# Patient Record
Sex: Male | Born: 1960 | Hispanic: No | Marital: Married | Smoking: Former smoker
Health system: Southern US, Community
[De-identification: ages and names within clinical notes are randomized; demographics above are authoritative.]

## PROBLEM LIST (undated history)

## (undated) DIAGNOSIS — N200 Calculus of kidney: Secondary | ICD-10-CM

## (undated) DIAGNOSIS — E079 Disorder of thyroid, unspecified: Secondary | ICD-10-CM

## (undated) DIAGNOSIS — I10 Essential (primary) hypertension: Secondary | ICD-10-CM

## (undated) HISTORY — PX: OTHER SURGICAL HISTORY: SHX169

## (undated) HISTORY — DX: Disorder of thyroid, unspecified: E07.9

## (undated) HISTORY — DX: Calculus of kidney: N20.0

---

## 2000-02-13 ENCOUNTER — Emergency Department (HOSPITAL_COMMUNITY): Admission: EM | Admit: 2000-02-13 | Discharge: 2000-02-13 | Payer: Self-pay | Admitting: Emergency Medicine

## 2000-09-06 ENCOUNTER — Emergency Department (HOSPITAL_COMMUNITY): Admission: EM | Admit: 2000-09-06 | Discharge: 2000-09-06 | Payer: Self-pay | Admitting: Emergency Medicine

## 2000-09-07 ENCOUNTER — Encounter: Payer: Self-pay | Admitting: Emergency Medicine

## 2000-09-09 ENCOUNTER — Encounter: Payer: Self-pay | Admitting: Urology

## 2000-09-09 ENCOUNTER — Encounter: Admission: RE | Admit: 2000-09-09 | Discharge: 2000-09-09 | Payer: Self-pay | Admitting: Urology

## 2000-09-11 ENCOUNTER — Ambulatory Visit (HOSPITAL_COMMUNITY): Admission: RE | Admit: 2000-09-11 | Discharge: 2000-09-11 | Payer: Self-pay | Admitting: Urology

## 2000-10-05 ENCOUNTER — Encounter: Admission: RE | Admit: 2000-10-05 | Discharge: 2000-10-05 | Payer: Self-pay | Admitting: Family Medicine

## 2001-04-19 ENCOUNTER — Encounter: Admission: RE | Admit: 2001-04-19 | Discharge: 2001-04-19 | Payer: Self-pay | Admitting: Family Medicine

## 2001-05-07 ENCOUNTER — Encounter: Admission: RE | Admit: 2001-05-07 | Discharge: 2001-05-07 | Payer: Self-pay | Admitting: Family Medicine

## 2001-05-18 ENCOUNTER — Encounter: Admission: RE | Admit: 2001-05-18 | Discharge: 2001-05-18 | Payer: Self-pay | Admitting: Family Medicine

## 2011-06-11 ENCOUNTER — Ambulatory Visit: Payer: BC Managed Care – PPO | Admitting: Family Medicine

## 2011-06-11 VITALS — BP 143/88 | HR 76 | Temp 98.1°F | Resp 16 | Ht 76.0 in | Wt 178.0 lb

## 2011-06-11 DIAGNOSIS — R351 Nocturia: Secondary | ICD-10-CM

## 2011-06-11 DIAGNOSIS — R350 Frequency of micturition: Secondary | ICD-10-CM

## 2011-06-11 LAB — POCT URINALYSIS DIPSTICK
Bilirubin, UA: NEGATIVE
Glucose, UA: NEGATIVE
Ketones, UA: NEGATIVE
Leukocytes, UA: NEGATIVE
Nitrite, UA: NEGATIVE
Protein, UA: NEGATIVE
Spec Grav, UA: <=1.005
Urobilinogen, UA: 0.2
pH, UA: 5.5

## 2011-06-11 LAB — POCT CBC
Granulocyte percent: 33.2 %G — AB (ref 37–80)
HCT, POC: 41.3 % — AB (ref 43.5–53.7)
Hemoglobin: 13.6 g/dL — AB (ref 14.1–18.1)
Lymph, poc: 1.7 (ref 0.6–3.4)
MCH, POC: 25.7 pg — AB (ref 27–31.2)
MCHC: 32.9 g/dL (ref 31.8–35.4)
MCV: 78.1 fL — AB (ref 80–97)
MID (cbc): 0.3 (ref 0–0.9)
MPV: 8.5 fL (ref 0–99.8)
POC Granulocyte: 1 — AB (ref 2–6.9)
POC LYMPH PERCENT: 56.5 %L — AB (ref 10–50)
POC MID %: 10.3 %M (ref 0–12)
Platelet Count, POC: 300 10*3/uL (ref 142–424)
RBC: 5.29 M/uL (ref 4.69–6.13)
RDW, POC: 14 %
WBC: 3.8 10*3/uL — AB (ref 4.6–10.2)

## 2011-06-11 LAB — PSA: PSA: 1.15 ng/mL (ref <=4.00)

## 2011-06-11 LAB — POCT UA - MICROSCOPIC ONLY
Bacteria, U Microscopic: NEGATIVE
Casts, Ur, LPF, POC: NEGATIVE
Crystals, Ur, HPF, POC: NEGATIVE
Epithelial cells, urine per micros: NEGATIVE
Mucus, UA: NEGATIVE
RBC, urine, microscopic: NEGATIVE
WBC, Ur, HPF, POC: NEGATIVE
Yeast, UA: NEGATIVE

## 2011-06-11 LAB — GLUCOSE, POCT (MANUAL RESULT ENTRY): POC Glucose: 101

## 2011-06-11 MED ORDER — CIPROFLOXACIN HCL 500 MG PO TABS: 500.0000 mg | ORAL_TABLET | Freq: Two times a day (BID) | ORAL | Status: AC

## 2011-06-11 NOTE — Patient Instructions (Signed)
Decrease caffeine intake, and avoid drinking caffeinated beverages in the afternoon.  Start antibiotic, and recheck in 1 week. Return to the clinic or go to the nearest emergency room if any of your symptoms worsen or new symptoms occur.

## 2011-06-11 NOTE — Progress Notes (Signed)
Subjective:    Patient ID: Vincent Gentry, male    DOB: 08/18/1960, 51 y.o.   MRN: [id number]  HPI Vincent Gentry is a 51 y.o. male C/o urinary frequency/nocturia - past 2 weeks.  4-5 episodes per night.  No prior similar symptoms.  No increased thirst, felt like lost weight  - about 4 pounds past few months.  Kidney stones in past - surgery x 2 - most recent in 2002 No primary care provider.    Married - no extramarital sexual contacts.  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 Multiple family members with diabetes. Drinks about 6 caffeineated drinks per day.  Questions repeated, rephrased at times - understanding expressed.  From [place] Review of Systems  Constitutional: Negative for fever and chills.  Eyes: Negative for visual disturbance.  Gastrointestinal: Negative for nausea, vomiting and abdominal pain.  Genitourinary: Positive for urgency, frequency and testicular pain. Negative for dysuria, hematuria and difficulty urinating.       Nocturia.  Occasional lower R groin to R testicle at times. Past 2 weeks.        Objective:   Physical Exam  Constitutional: He is oriented to person, place, and time. He appears well-developed and well-nourished.  HENT:  Head: Normocephalic and atraumatic.  Cardiovascular: Normal rate, regular rhythm, normal heart sounds and intact distal pulses.   Pulmonary/Chest: Effort normal and breath sounds normal.  Abdominal: Soft. Bowel sounds are normal. There is tenderness in the right lower quadrant. There is no rebound and no guarding. No hernia. Hernia confirmed negative in the right inguinal area and confirmed negative in the left inguinal area.       minimal rlq ttp with deep palpation.   Midline lower scar well-healed.    Genitourinary: Testes normal and penis normal. Prostate is not enlarged. Right testis shows no tenderness. Left testis shows no tenderness. No discharge found.  Neurological: He is alert and oriented to person, place, and time.    Skin: Skin is warm and dry. No rash noted.  Psychiatric: He has a normal mood and affect. His behavior is normal.    Results for orders placed in visit on 06/11/11  POCT UA - MICROSCOPIC ONLY      Component Value Range   WBC, Ur, HPF, POC neg     RBC, urine, microscopic neg     Bacteria, U Microscopic neg     Mucus, UA neg     Epithelial cells, urine per micros neg     Crystals, Ur, HPF, POC neg     Casts, Ur, LPF, POC neg     Yeast, UA neg    POCT URINALYSIS DIPSTICK      Component Value Range   Color, UA yellow     Clarity, UA clear     Glucose, UA neg     Bilirubin, UA neg     Ketones, UA neg     Spec Grav, UA <=1.005     Blood, UA trace-intact     pH, UA 5.5     Protein, UA neg     Urobilinogen, UA 0.2     Nitrite, UA neg     Leukocytes, UA Negative    POCT CBC      Component Value Range   WBC 3.8 (*) 4.6 - 10.2 (K/uL)   Lymph, poc 1.7  0.6 - 3.4    POC LYMPH PERCENT 56.5 (*) 10 - 50 (%L)   MID (cbc) 0.3  0 - 0.9  POC MID % 10.3  0 - 12 (%M)   POC Granulocyte 1.0 (*) 2 - 6.9    Granulocyte percent 33.2 (*) 37 - 80 (%G)   RBC 5.29  4.69 - 6.13 (M/uL)   Hemoglobin 13.6 (*) 14.1 - 18.1 (g/dL)   HCT, POC 09.8 (*) 11.9 - 53.7 (%)   MCV 78.1 (*) 80 - 97 (fL)   MCH, POC 25.7 (*) 27 - 31.2 (pg)   MCHC 32.9  31.8 - 35.4 (g/dL)   RDW, POC 14.7     Platelet Count, POC 300  142 - 424 (K/uL)   MPV 8.5  0 - 99.8 (fL)  GLUCOSE, POCT (MANUAL RESULT ENTRY)      Component Value Range   POC Glucose 101           Assessment & Plan:    Vincent Gentry is a 50 y.o. male 1. Frequent urination at night  POCT UA - Microscopic Only, POCT urinalysis dipstick, PSA, POCT glucose (manual entry)  2. Urinary frequency  POCT CBC, POCT glucose (manual entry)   Reassuring cbc and UA.  Possible subclinical prostatitis vs. epididymitis with episodic R testicular pain.  Trace hematuria on U/a - hx of nephrolithiasis. -- ddx includes nephrolith.  Check PSA, start cipro 500mg  BID #20,  decrease caffeine intake by 1/2, and avoid afternoon caffeine, and recheck in 1 week, sooner if worse.

## 2011-06-18 ENCOUNTER — Ambulatory Visit: Payer: BC Managed Care – PPO | Admitting: Family Medicine

## 2011-06-18 VITALS — BP 139/78 | HR 71 | Temp 98.6°F | Resp 18 | Wt 176.0 lb

## 2011-06-18 DIAGNOSIS — E049 Nontoxic goiter, unspecified: Secondary | ICD-10-CM

## 2011-06-18 DIAGNOSIS — R350 Frequency of micturition: Secondary | ICD-10-CM

## 2011-06-18 DIAGNOSIS — N419 Inflammatory disease of prostate, unspecified: Secondary | ICD-10-CM

## 2011-06-18 LAB — THYROID PANEL WITH TSH
Free Thyroxine Index: 6.7 — ABNORMAL HIGH (ref 1.0–3.9)
T3 Uptake: 40.1 % — ABNORMAL HIGH (ref 22.5–37.0)
T4, Total: 16.7 ug/dL — ABNORMAL HIGH (ref 5.0–12.5)
TSH: <0.008 u[IU]/mL — ABNORMAL LOW (ref 0.350–4.500)

## 2011-06-18 NOTE — Progress Notes (Signed)
Subjective: Patient is here for followup with regard to last visit visit. He was here with frequent nocturia. He has taken a course of Cipro and decrease his caffeine intake in the evenings, and is urinating much less at nighttime now. He has not quite yet faced the Cipro. I reviewed his laboratory tests from last week which are below, and they all look good. Results for orders placed in visit on 06/11/11  POCT UA - MICROSCOPIC ONLY      Component Value Range   WBC, Ur, HPF, POC neg     RBC, urine, microscopic neg     Bacteria, U Microscopic neg     Mucus, UA neg     Epithelial cells, urine per micros neg     Crystals, Ur, HPF, POC neg     Casts, Ur, LPF, POC neg     Yeast, UA neg    POCT URINALYSIS DIPSTICK      Component Value Range   Color, UA yellow     Clarity, UA clear     Glucose, UA neg     Bilirubin, UA neg     Ketones, UA neg     Spec Grav, UA <=1.005     Blood, UA trace-intact     pH, UA 5.5     Protein, UA neg     Urobilinogen, UA 0.2     Nitrite, UA neg     Leukocytes, UA Negative    POCT CBC      Component Value Range   WBC 3.8 (*) 4.6 - 10.2 (K/uL)   Lymph, poc 1.7  0.6 - 3.4    POC LYMPH PERCENT 56.5 (*) 10 - 50 (%L)   MID (cbc) 0.3  0 - 0.9    POC MID % 10.3  0 - 12 (%M)   POC Granulocyte 1.0 (*) 2 - 6.9    Granulocyte percent 33.2 (*) 37 - 80 (%G)   RBC 5.29  4.69 - 6.13 (M/uL)   Hemoglobin 13.6 (*) 14.1 - 18.1 (g/dL)   HCT, POC 16.1 (*) 09.6 - 53.7 (%)   MCV 78.1 (*) 80 - 97 (fL)   MCH, POC 25.7 (*) 27 - 31.2 (pg)   MCHC 32.9  31.8 - 35.4 (g/dL)   RDW, POC 04.5     Platelet Count, POC 300  142 - 424 (K/uL)   MPV 8.5  0 - 99.8 (fL)  PSA      Component Value Range   PSA 1.15  <=4.00 (ng/mL)  GLUCOSE, POCT (MANUAL RESULT ENTRY)      Component Value Range   POC Glucose 101      He has had a goiter for many years since he was young. It was looked at in [place] many years ago. He has never had a thyroid scan. After going back to [place] he thinks it  has gotten a little bit larger. Cord is run in his family.  Objective: Right lower large and obviously visible goiter in his neck. It is roughly the size of my 4 fingers. No major masses though there is some mild nodularity of it.  No CVA tenderness. Abdomen soft without mass or tenderness.  Assessment: Urinary frequency, improved Possible low grade prostatitis, resolved with Cipro Thyroid goiter  Plan: Check thyroid scan and proceed from there. There are regions of [place] that have a lot of goiters due to the deficiency of iodine, which is probably his situation. However due to the size of his  goiter we will have to decide what needs to be done after the scan has been done.  If his urinary frequency increases upon finishing the Cipro, then we probably should go ahead and treat him for a 30 day course as if he did have some chronic prostatitis.

## 2011-06-18 NOTE — Patient Instructions (Signed)
Goiter  Goiter is an enlarged thyroid gland. The thyroid gland sits at the base of the front of the neck. The gland produces hormones that regulate mood, body temperature, pulse rate, and digestion. Most goiters are painless and are not a cause for serious concern. Goiters and conditions that cause goiters can be treated if necessary.    CAUSES    Common causes of goiter include:   Graves disease (causes too much hormone to be produced [hyperthyroidism]).   Hashimoto's disease (causes too little hormone to be produced [hypothyroidism]).   Thyroiditis (inflammation of the thyroid sometimes caused by virus or pregnancy).   Nodular goiter (small bumps form; sometimes called toxic nodular goiter).   Pregnancy.   Thyroid cancer (very few goiters with nodules are cancerous).   Certain medications.   Radiation exposure.   Iodine deficiency (more common in developing countries in inland populations).  RISK FACTORS  Risk factors for goiter include:   A family history of goiter.   Male gender.   Inadequate iodine in the diet.   Age older than 40 years.  SYMPTOMS    Many goiters do not cause symptoms. When symptoms do occur, they may include:   Swelling in the lower part of the neck. This swelling can range from a very small bump to a large lump.   A tight feeling in the throat.   A hoarse voice.  Less commonly, a goiter may result in:   Coughing.   Wheezing.   Difficulty swallowing.   Difficulty breathing.   Bulging neck veins.   Dizziness.  When a goiter is the result of hyperthyroidism, symptoms may include:   Rapid or irregular heart beat.   Sicknessin your stomach (nausea).   Vomiting.   Diarrhea.   Shaking.   Irritable feeling.   Bulging eyes.   Weight loss.   Heat sensitivity.   Anxiety.  When a goiter is the result of hypothyroidism, symptoms may include:   Tiredness.   Dry skin.   Constipation.   Weight gain.   Irregular menstrual cycle.   Depressed mood.    Sensitivity to cold.  DIAGNOSIS    Tests used to diagnose goiter include:   A physical exam.   Blood tests, including thyroid hormone levels and antibody testing.   Ultrasonography, computerized X-ray scan (computed tomography, CT) or computerized magnetic scan (magnetic resonance imaging, MRI).   Thyroid scan (imaging along with safe radioactive injection).   Tissue sample taken (biopsy) of nodules. This is sometimes done to confirm that the nodules are not cancerous.  TREATMENT    Treatment will depend on the cause of the goiter. Treatment may include:   Monitoring. In some cases, no treatment is necessary, and your doctor will monitor yourcondition at regular check ups.   Medications and supplements. Thyroid medication (thyroid hormone replacement) is available for hyperthroidism and hypothyroidism.   If inflammation is the cause, over-the-counter medication or steroid medication may be recommended.   Goiters caused by iodine deficiency can be treated with iodine supplements or changes in diet.   Radioactive iodine treatment. Radioactive iodine is injected into the blood. It travels to the thyroid gland, kills thyroid cells, and reduces the size of the gland. This is only used when the thyroid gland is overactive. Lifelong thyroid hormone medication is often necessary after this treatment.   Surgery. A procedure to remove all or part of the gland may be recommended in severe cases or when cancer is the cause. Hormones can be taken   to replace the hormones normally produced by the thyroid.  HOME CARE INSTRUCTIONS     Take medications as directed.   Follow your caregiver's recommendations for any dietary changes.   Follow up with your caregiver for further examination and testing, as directed.  PREVENTION     If you have a family history of goiter, discuss screening with your doctor.   Make sure you are getting enough iodine in your diet.    Use of iodized table salt can help prevent iodine deficiency.  Document Released: 07/17/2009 Document Revised: 01/16/2011 Document Reviewed: 07/17/2009  [employer] Patient Information 2012 [employer].

## 2011-06-21 ENCOUNTER — Telehealth: Payer: Self-pay | Admitting: Family Medicine

## 2011-06-21 NOTE — Telephone Encounter (Signed)
Spoke with patient.  Thyroid is overactive.  He will return 4-5 days after thyroid scan is done. c/w Grave's Disease.

## 2011-06-26 ENCOUNTER — Encounter (HOSPITAL_COMMUNITY)
Admission: RE | Admit: 2011-06-26 | Discharge: 2011-06-26 | Disposition: A | Discharge status: Home / Self Care | Payer: BC Managed Care – PPO | Source: Ambulatory Visit | Attending: Family Medicine | Admitting: Family Medicine

## 2011-06-26 DIAGNOSIS — E049 Nontoxic goiter, unspecified: Secondary | ICD-10-CM | POA: Insufficient documentation

## 2011-06-27 ENCOUNTER — Encounter (HOSPITAL_COMMUNITY)
Admission: RE | Admit: 2011-06-27 | Discharge: 2011-06-27 | Disposition: A | Discharge status: Home / Self Care | Payer: BC Managed Care – PPO | Source: Ambulatory Visit | Attending: Family Medicine | Admitting: Family Medicine

## 2011-06-27 DIAGNOSIS — E0520 Thyrotoxicosis with toxic multinodular goiter without thyrotoxic crisis or storm: Secondary | ICD-10-CM | POA: Insufficient documentation

## 2011-06-27 MED ORDER — SODIUM PERTECHNETATE TC 99M INJECTION
10.0000 | Freq: Once | INTRAVENOUS | Status: AC | PRN
  Administered 2011-06-27: 10 via INTRAVENOUS

## 2011-07-02 ENCOUNTER — Encounter: Payer: Self-pay | Admitting: Family Medicine

## 2011-07-02 ENCOUNTER — Ambulatory Visit (INDEPENDENT_AMBULATORY_CARE_PROVIDER_SITE_OTHER): Payer: BC Managed Care – PPO | Admitting: Family Medicine

## 2011-07-02 VITALS — BP 134/76 | HR 83 | Temp 98.0°F | Resp 16 | Ht 73.0 in | Wt 175.0 lb

## 2011-07-02 DIAGNOSIS — E0590 Thyrotoxicosis, unspecified without thyrotoxic crisis or storm: Secondary | ICD-10-CM

## 2011-07-02 NOTE — Progress Notes (Signed)
This 51 year old married man with 2 children works at [employer] and is trained as a Comptroller in [place]. He comes in with a history of weight loss, tremulousness, and fatigue. Dr. Alwyn Ren has been working him up for a thyroid condition. He's had a goiter since 1982 and has a family history of thyroid disease. The symptoms of hyperthyroidism have started over the last several months. He's had to change clothing because of or weight loss.  Objective: Very pleasant man in no acute distress Very fine tremor Palpable enlarged multinodular goiter  RADIOLOGY REPORT*  Clinical Data: Clinical hyperthyroidism.  THYROID SCAN AND UPTAKE - 24 HOURS  Technique: Following the per oral administration of I-131 sodium  iodide, the patient returned at 24 hours and uptake measurements  were acquired with the uptake probe centered on the neck. Thyroid  imaging was performed following the intravenous administration of  the Tc-41m Pertechnetate.  Radi[employer]armaceuticals: 13.9 uCi I-131 Sodium Iodide and 10.0 mCi TC-  59m Pertechnetate  Comparison: None  Findings: The thyroid gland is markedly enlarged and demonstrates a  very heterogeneous nodular appearance on the thyroid scan. I  suspect there are multiple hyperfunctioning warm/hot nodules  suppressing the rest of the gland. No obvious dominant cold  lesion.  The 24 hour I-131 uptake was calculated at 46%. Normal is 10-30%.  IMPRESSION:  1. Multinodular toxic goiter.  2. Elevated 24 hour I-131 uptake at 46%.   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 If we can accomplish this this week then I will put him on metoprolol.

## 2011-07-02 NOTE — Patient Instructions (Signed)
     RADIOLOGY REPORT*  Clinical Data: Clinical hyperthyroidism.  THYROID SCAN AND UPTAKE - 24 HOURS  Technique: Following the per oral administration of I-131 sodium  iodide, the patient returned at 24 hours and uptake measurements  were acquired with the uptake probe centered on the neck. Thyroid  imaging was performed following the intravenous administration of  the Tc-90m Pertechnetate.  Radi[employer]arm[employer]: 13.9 uCi I-131 Sodium Iodide and 10.0 mCi TC-  36m Pertechnetate  Comparison: None  Findings: The thyroid gland is markedly enlarged and demonstrates a  very heterogeneous nodular appearance on the thyroid scan. I  suspect there are multiple hyperfunctioning warm/hot nodules  suppressing the rest of the gland. No obvious dominant cold  lesion.  The 24 hour I-131 uptake was calculated at 46%. Normal is 10-30%.  IMPRESSION:  1. Multinodular toxic goiter.  2. Elevated 24 hour I-131 uptake at 46%.   Range T4, Total 16.7 (H) 5.0 - 12.5 ug/dL T3 Uptake 45.4 (H) 09.8 - 37.0 % Free Thyroxine Index 6.7 (H) 1.0 - 3.9 TSH <0.008 (L) 0.350 - 4.500 uIU/mL Resulting Agency [facility]

## 2011-07-22 ENCOUNTER — Other Ambulatory Visit: Payer: Self-pay | Admitting: Endocrinology

## 2011-07-22 DIAGNOSIS — E0590 Thyrotoxicosis, unspecified without thyrotoxic crisis or storm: Secondary | ICD-10-CM

## 2011-08-01 ENCOUNTER — Encounter (HOSPITAL_COMMUNITY)
Admission: RE | Admit: 2011-08-01 | Discharge: 2011-08-01 | Disposition: A | Discharge status: Home / Self Care | Payer: BC Managed Care – PPO | Source: Ambulatory Visit | Attending: Endocrinology | Admitting: Endocrinology

## 2011-08-01 DIAGNOSIS — E0590 Thyrotoxicosis, unspecified without thyrotoxic crisis or storm: Secondary | ICD-10-CM | POA: Insufficient documentation

## 2011-08-01 MED ORDER — SODIUM IODIDE I 131 CAPSULE
31.4000 | Freq: Once | INTRAVENOUS | Status: AC | PRN
  Administered 2011-08-01: 31.4 via ORAL

## 2011-08-04 ENCOUNTER — Ambulatory Visit (INDEPENDENT_AMBULATORY_CARE_PROVIDER_SITE_OTHER): Payer: BC Managed Care – PPO | Admitting: Family Medicine

## 2011-08-04 VITALS — BP 124/77 | HR 81 | Temp 98.7°F | Resp 16 | Ht 72.0 in | Wt 176.0 lb

## 2011-08-04 DIAGNOSIS — Z23 Encounter for immunization: Secondary | ICD-10-CM

## 2011-08-04 DIAGNOSIS — Z789 Other specified health status: Secondary | ICD-10-CM

## 2011-08-04 MED ORDER — MENINGOCOCCAL A C Y&W-135 CONJ IM INJ
0.5000 mL | INJECTION | Freq: Once | INTRAMUSCULAR | Status: AC
  Administered 2011-08-04: 0.5 mL via INTRAMUSCULAR

## 2011-08-04 MED ORDER — CIPROFLOXACIN HCL 500 MG PO TABS: ORAL_TABLET | ORAL | Status: DC

## 2011-08-04 NOTE — Patient Instructions (Addendum)
Go to [email or web address] for travel information for [place].  [email or web address]

## 2011-08-04 NOTE — Progress Notes (Signed)
Subjective: Patient is going to [place] in 2 weeks. He would like a meningococcal vaccine. He was presenting some time in [place], then in [place], then in [place].  He does not want to malaria prophylaxis. He says he never gets malaria. I cautioned him that having been gone for the stretcher for a long time he might be a higher risk of it. He does not want hepatitis vaccine either.  Objective: Not read not examined today  Assessment: Trouble medicine with vaccination update  Plan: Meningitis vaccine Cipro prescription for traveler's diarrhea Gave him the site he can look up the CDC guidelines for travel to [place].

## 2013-06-20 ENCOUNTER — Ambulatory Visit: Payer: BC Managed Care – PPO | Admitting: Physician Assistant

## 2013-06-20 VITALS — BP 124/78 | HR 69 | Temp 98.2°F | Resp 18 | Ht 72.0 in | Wt 193.0 lb

## 2013-06-20 DIAGNOSIS — M5380 Other specified dorsopathies, site unspecified: Secondary | ICD-10-CM

## 2013-06-20 DIAGNOSIS — M62830 Muscle spasm of back: Secondary | ICD-10-CM

## 2013-06-20 DIAGNOSIS — Z87442 Personal history of urinary calculi: Secondary | ICD-10-CM | POA: Insufficient documentation

## 2013-06-20 DIAGNOSIS — R319 Hematuria, unspecified: Secondary | ICD-10-CM

## 2013-06-20 DIAGNOSIS — M549 Dorsalgia, unspecified: Secondary | ICD-10-CM

## 2013-06-20 LAB — POCT URINALYSIS DIPSTICK
Bilirubin, UA: NEGATIVE
GLUCOSE UA: NEGATIVE
Ketones, UA: NEGATIVE
Leukocytes, UA: NEGATIVE
NITRITE UA: NEGATIVE
PH UA: 5.5
PROTEIN UA: NEGATIVE
UROBILINOGEN UA: 0.2

## 2013-06-20 LAB — POCT UA - MICROSCOPIC ONLY
Bacteria, U Microscopic: NEGATIVE
CASTS, UR, LPF, POC: NEGATIVE
CRYSTALS, UR, HPF, POC: NEGATIVE
Mucus, UA: NEGATIVE
WBC, Ur, HPF, POC: NEGATIVE
Yeast, UA: NEGATIVE

## 2013-06-20 MED ORDER — CYCLOBENZAPRINE HCL 5 MG PO TABS: 5.0000 mg | ORAL_TABLET | Freq: Three times a day (TID) | ORAL | Status: DC | PRN

## 2013-06-20 MED ORDER — MELOXICAM 7.5 MG PO TABS: 7.5000 mg | ORAL_TABLET | Freq: Every day | ORAL | Status: DC

## 2013-06-20 NOTE — Progress Notes (Signed)
I was directly involved with the patient's care and agree with the physical, diagnosis and treatment plan.  

## 2013-06-20 NOTE — Progress Notes (Signed)
Subjective:    Patient ID: Vincent Gentry, male    DOB: 04/10/1960, 53 y.o.   MRN: [id number]  HPI 52y.o male presents with 2 days of lower back pain.  Pain is localized lumbar region on both sides of spinal column without radiation to LE.  Pain is made worse with changing of position and with bending down to touch toes.  Never had back pain like this before.  Pt has hx of kidney stones, but this does not feel at all similar.  Pt states he is active and plays soccer with his kids but denies any inciting event.  Denies saddle anesthesia, bowel or bladder incontinence, new numbness or weakness of LE, personal cancer hx, IV drug use, fever, N/V/D.  Pt also had two days of dark brown urine last week that has since resolved.  No associated dysuria.  Pt has recently increased water intake so he is unsure if increase in urinary frequency is due to water or another reason.  Smoked 1ppd for 20 years, but quit in 2001.    Review of Systems  Constitutional: Negative for chills and fatigue.  HENT: Negative.   Eyes: Negative.   Respiratory: Negative for cough, shortness of breath and wheezing.   Cardiovascular: Negative for leg swelling.  Gastrointestinal: Negative for nausea, vomiting, diarrhea and abdominal distention.  Genitourinary: Negative for flank pain.  Skin: Negative for color change and rash.  Neurological: Negative for dizziness.  Hematological: Negative.        Objective:   Physical Exam  Constitutional: He is oriented to person, place, and time. He appears well-developed and well-nourished. No distress.  BP 124/78  Pulse 69  Temp(Src) 98.2 F (36.8 C) (Oral)  Resp 18  Ht 6' (1.829 m)  Wt 193 lb (87.544 kg)  BMI 26.17 kg/m2  SpO2 98%   HENT:  Head: Normocephalic.  Eyes: Conjunctivae are normal. Pupils are equal, round, and reactive to light.  Neck: Normal range of motion.  Cardiovascular: Normal rate, regular rhythm, normal heart sounds and intact distal pulses.  Exam  reveals no gallop and no friction rub.   No murmur heard. Pulmonary/Chest: Effort normal and breath sounds normal. No respiratory distress. He has no wheezes. He exhibits no tenderness.  Musculoskeletal:       Right hip: Normal.       Left hip: Normal.       Lumbar back: He exhibits normal range of motion (pain with lumbar flexion), no tenderness and no bony tenderness.  Neurological: He is alert and oriented to person, place, and time. He has normal strength. No sensory deficit. Gait normal.  Reflex Scores:      Patellar reflexes are 2+ on the right side and 2+ on the left side.      Achilles reflexes are 2+ on the right side and 2+ on the left side. EHL, hip flexion, knee flexion/ext strength symmetrical bilat.  Gait normal  Skin: Skin is warm and dry. No rash noted.  Psychiatric: He has a normal mood and affect. His behavior is normal.        Assessment & Plan:   1. Back pain 2. Muscle spasm of back Most likely musculoskeletal in nature.  Recommended heat and stretching in addition to pharm therapy  - POCT urinalysis dipstick - POCT UA - Microscopic Only - cyclobenzaprine (FLEXERIL) 5 MG tablet; Take 1 tablet (5 mg total) by mouth 3 (three) times daily as needed for muscle spasms.  Dispense: 30 tablet; Refill: 0 -  meloxicam (MOBIC) 7.5 MG tablet; Take 1-2 tablets (7.5-15 mg total) by mouth daily.  Dispense: 30 tablet; Refill: 0   3. Hematuria Will return in 10 days to recheck urine - due to age and smoking history - if still hematuria present will refer to urology

## 2013-06-20 NOTE — Patient Instructions (Addendum)
You can use heat and stretching to help with back pain   Return to clinic in 10 days to repeat urine analysis

## 2013-07-01 ENCOUNTER — Ambulatory Visit: Payer: BC Managed Care – PPO | Admitting: Physician Assistant

## 2013-07-01 VITALS — BP 126/74 | HR 79 | Temp 98.4°F | Resp 17 | Ht 72.5 in | Wt 194.0 lb

## 2013-07-01 DIAGNOSIS — R319 Hematuria, unspecified: Secondary | ICD-10-CM

## 2013-07-01 LAB — POCT URINALYSIS DIPSTICK
BILIRUBIN UA: NEGATIVE
Blood, UA: NEGATIVE
Glucose, UA: NEGATIVE
Ketones, UA: NEGATIVE
LEUKOCYTES UA: NEGATIVE
NITRITE UA: NEGATIVE
PH UA: 5.5
PROTEIN UA: NEGATIVE
Spec Grav, UA: <=1.005
Urobilinogen, UA: 0.2

## 2013-07-01 LAB — POCT UA - MICROSCOPIC ONLY
Bacteria, U Microscopic: NEGATIVE
Casts, Ur, LPF, POC: NEGATIVE
Crystals, Ur, HPF, POC: NEGATIVE
Mucus, UA: NEGATIVE
RBC, URINE, MICROSCOPIC: NEGATIVE
WBC, UR, HPF, POC: NEGATIVE
YEAST UA: NEGATIVE

## 2013-07-01 NOTE — Patient Instructions (Signed)
See Dr Alwyn Ren in the next several months for a CPE - it is time for your colon cancer screening.  This would also be a good time to check your cholesterol.  You can call ahead of time to make sure you can come to the office when he is available.  When you do come in to see him you will want to come in fasting which means you have not had anything to at in at least 6 hours.

## 2013-07-01 NOTE — Progress Notes (Signed)
   Subjective:    Patient ID: Vincent Gentry, male    DOB: May 18, 1960, 53 y.o.   MRN: [id number]  HPI  Pt presents for a recheck.  His back pain is almost completely resolved and he feels much better.  He has noticed no more dark colored urine and feels good.  Review of Systems  Constitutional: Negative for fever and chills.  Genitourinary: Negative for dysuria, frequency and hematuria.       Objective:   Physical Exam  Constitutional: He is oriented to person, place, and time. He appears well-developed and well-nourished.  HENT:  Head: Normocephalic and atraumatic.  Right Ear: External ear normal.  Left Ear: External ear normal.  Pulmonary/Chest: Effort normal.  Neurological: He is alert and oriented to person, place, and time.  Skin: Skin is warm and dry.  Psychiatric: He has a normal mood and affect. His behavior is normal. Judgment and thought content normal.   Results for orders placed in visit on 07/01/13  POCT UA - MICROSCOPIC ONLY      Result Value Ref Range   WBC, Ur, HPF, POC neg     RBC, urine, microscopic neg     Bacteria, U Microscopic neg     Mucus, UA neg     Epithelial cells, urine per micros 0-1     Crystals, Ur, HPF, POC neg     Casts, Ur, LPF, POC neg     Yeast, UA neg    POCT URINALYSIS DIPSTICK      Result Value Ref Range   Color, UA yellow     Clarity, UA clear     Glucose, UA neg     Bilirubin, UA neg     Ketones, UA neg     Spec Grav, UA <=1.005     Blood, UA neg     pH, UA 5.5     Protein, UA neg     Urobilinogen, UA 0.2     Nitrite, UA neg     Leukocytes, UA Negative         Assessment & Plan:  Hematuria - Plan: POCT UA - Microscopic Only, POCT urinalysis dipstick  Today the patient has no blood in his urine.  Due to his age and remote smoking history and small amount of blood at last visit and the fact that he has not had a CPE he plans to RTC to have his CPE with Dr Alwyn Ren.  At that time, I would recommend a repeat urine and if blood  at that time refer to urology.  He is also interested in a colonoscopy that he will talk with Dr Alwyn Ren about at that visit.  Benny Lennert PA-C  [facility] 07/01/2013 1:04 PM

## 2013-07-26 ENCOUNTER — Encounter: Payer: Self-pay | Admitting: Physician Assistant

## 2013-07-26 DIAGNOSIS — E0590 Thyrotoxicosis, unspecified without thyrotoxic crisis or storm: Secondary | ICD-10-CM | POA: Insufficient documentation

## 2013-12-01 ENCOUNTER — Encounter: Payer: Self-pay | Admitting: *Deleted

## 2013-12-01 DIAGNOSIS — E0590 Thyrotoxicosis, unspecified without thyrotoxic crisis or storm: Secondary | ICD-10-CM

## 2016-05-20 ENCOUNTER — Ambulatory Visit (INDEPENDENT_AMBULATORY_CARE_PROVIDER_SITE_OTHER): Payer: BLUE CROSS/BLUE SHIELD | Admitting: Family Medicine

## 2016-05-20 VITALS — BP 102/64 | HR 88 | Temp 99.1°F | Ht 73.0 in | Wt 204.2 lb

## 2016-05-20 DIAGNOSIS — M542 Cervicalgia: Secondary | ICD-10-CM

## 2016-05-20 DIAGNOSIS — M5412 Radiculopathy, cervical region: Secondary | ICD-10-CM | POA: Diagnosis not present

## 2016-05-20 DIAGNOSIS — I4510 Unspecified right bundle-branch block: Secondary | ICD-10-CM

## 2016-05-20 DIAGNOSIS — R51 Headache: Secondary | ICD-10-CM

## 2016-05-20 DIAGNOSIS — R030 Elevated blood-pressure reading, without diagnosis of hypertension: Secondary | ICD-10-CM | POA: Diagnosis not present

## 2016-05-20 DIAGNOSIS — R002 Palpitations: Secondary | ICD-10-CM

## 2016-05-20 DIAGNOSIS — E0590 Thyrotoxicosis, unspecified without thyrotoxic crisis or storm: Secondary | ICD-10-CM

## 2016-05-20 DIAGNOSIS — E0520 Thyrotoxicosis with toxic multinodular goiter without thyrotoxic crisis or storm: Secondary | ICD-10-CM | POA: Insufficient documentation

## 2016-05-20 DIAGNOSIS — M79672 Pain in left foot: Secondary | ICD-10-CM | POA: Diagnosis not present

## 2016-05-20 DIAGNOSIS — M79645 Pain in left finger(s): Secondary | ICD-10-CM

## 2016-05-20 DIAGNOSIS — R519 Headache, unspecified: Secondary | ICD-10-CM

## 2016-05-20 MED ORDER — MELOXICAM 7.5 MG PO TABS: 7.5000 mg | ORAL_TABLET | Freq: Every day | ORAL | 0 refills | Status: DC

## 2016-05-20 MED ORDER — CYCLOBENZAPRINE HCL 5 MG PO TABS: 5.0000 mg | ORAL_TABLET | Freq: Every day | ORAL | 0 refills | Status: DC

## 2016-05-20 MED ORDER — CYCLOBENZAPRINE HCL 5 MG PO TABS: 5.0000 mg | ORAL_TABLET | Freq: Three times a day (TID) | ORAL | 0 refills | Status: DC | PRN

## 2016-05-20 NOTE — Progress Notes (Signed)
Subjective:  By signing my name below, I, Essence Howell, attest that this documentation has been prepared under the direction and in the presence of Shade Flood, MD Electronically Signed: Charline Bills, ED Scribe 05/20/2016 at 6:13 PM.   Patient ID: Vincent Gentry, male    DOB: [birth year], 56 y.o.   MRN: [id number]  Chief Complaint  Patient presents with  . Pain    left arm pain radiating to left thumb,warm to the touch x2 weeks   HPI Vincent Gentry is a 56 y.o. male who presents to Primary Care at [facility] complaining of intermittent left arm pain that starts in the left side of his neck and radiates into his left thumb for 2 weeks. Pt describes pain as a burning, tingling sensation. He states that he occasionally has similar pain in his left great toe but not in any other toes. Pt reports experiencing similar pain approximately 5 years ago when he was referred to endocrinologist Dr. Leslie Dales and treated with radiation for hyperthyroidism. He states that he is not currently taking medications for thyroid disease. Pt had a low TSH of 0.13, normal Free T3 and Free T4 yesterday through [facility]. He denies chest pain, weakness, tingling in his lower extremities.   Headaches Pt reports intermittent headaches for approximately 3 weeks. He states that he has been treating HAs with 500 mg Tylenol twice daily for the past 3 weeks with temporary relief. Despite treatment, he reports that his longest HA lasted for 3 days. Pt states that he has also been experiencing heart palpitations for approximately 1 month since starting a new unknown medication. Pt describes palpitations as heart racing but he denies any today. He reports 1 episode of sob accompanied with palpitations while carrying in groceries but none since. He also denies numbness, blurred or double vision, light-headedness, slurred speech, facial drooping. No previous cardiac related issues or cardiology evaluation.   Patient Active  Problem List   Diagnosis Date Noted  . Hyperthyroidism 07/26/2013  . History of kidney stones 06/20/2013   Past Medical History:  Diagnosis Date  . Kidney stone   . Thyroid disease    Past Surgical History:  Procedure Laterality Date  . kidney stone removal     No Known Allergies Prior to Admission medications   Medication Sig Start Date End Date Taking? Authorizing Provider  ciprofloxacin (CIPRO) 500 MG tablet Take one twice daily if needed for travelers diarrhea Patient not taking: Reported on 05/20/2016 08/04/11   Peyton Najjar, MD  cyclobenzaprine (FLEXERIL) 5 MG tablet Take 1 tablet (5 mg total) by mouth 3 (three) times daily as needed for muscle spasms. Patient not taking: Reported on 05/20/2016 06/20/13   Morrell Riddle, PA-C  meloxicam (MOBIC) 7.5 MG tablet Take 1-2 tablets (7.5-15 mg total) by mouth daily. Patient not taking: Reported on 05/20/2016 06/20/13   Morrell Riddle, PA-C   Social History   Social History  . Marital status: Married    Spouse name: N/A  . Number of children: N/A  . Years of education: N/A   Occupational History  . Not on file.   Social History Main Topics  . Smoking status: Former Games developer  . Smokeless tobacco: Not on file  . Alcohol use No  . Drug use: Unknown  . Sexual activity: Not on file   Other Topics Concern  . Not on file   Social History Narrative   From the [place] -    Review of Systems  Eyes: Negative for visual disturbance.  Respiratory: Shortness of breath: 1 episode.   Cardiovascular: Positive for palpitations. Negative for chest pain.  Musculoskeletal: Positive for arthralgias, myalgias and neck pain.  Neurological: Positive for numbness and headaches. Negative for facial asymmetry, speech difficulty, weakness and light-headedness.      Objective:   Physical Exam  Constitutional: He is oriented to person, place, and time. He appears well-developed and well-nourished. No distress.  HENT:  Head: Normocephalic and  atraumatic.  Eyes: Conjunctivae and EOM are normal. Pupils are equal, round, and reactive to light.  Neck: Neck supple. No tracheal deviation present.  Intact ROM at the cervical spine. R lateral flexion of the neck did reproduce L arm symptoms. Negative Tinnel over brachial plexus on the L.  Cardiovascular: Normal rate, regular rhythm and normal heart sounds.  Exam reveals no gallop and no friction rub.   No murmur heard. Pulmonary/Chest: Effort normal and breath sounds normal. No respiratory distress.  Musculoskeletal: Normal range of motion.  L foot: tender along great toe MTP.  L hand: tender over the [facility]st [facility] joint but normal ROM. No swelling. Cap refill less than 1 second. NVI distally. Radial pulse 2+. Normal and intact strength R and L arms.   Neurological: He is alert and oriented to person, place, and time. He displays a negative Romberg sign.  Reflex Scores:      Tricep reflexes are 2+ on the right side and 2+ on the left side.      Bicep reflexes are 2+ on the right side and 2+ on the left side.      Brachioradialis reflexes are 2+ on the right side and 2+ on the left side. No pronator drift.   Skin: Skin is warm and dry.  Skin is intact. No rash.  Psychiatric: He has a normal mood and affect. His behavior is normal.  Nursing note and vitals reviewed.  Vitals:   05/20/16 1751 05/20/16 1755  BP: (!) 146/77 102/64  Pulse: 91 88  Temp: 99.1 F (37.3 C)   TempSrc: Oral   Weight: 204 lb 3.2 oz (92.6 kg)   Height:  (1.854 m)    EKG reading done by Shade Flood, MD: Sinus rhythm RBBB. No prior EKG to review.    Assessment & Plan:    Vincent Gentry is a 56 y.o. male Left cervical radiculopathy - Plan: meloxicam (MOBIC) 7.5 MG tablet, cyclobenzaprine (FLEXERIL) 5 MG tablet, DISCONTINUED: cyclobenzaprine (FLEXERIL) 5 MG tablet Pain of left thumb - Plan: meloxicam (MOBIC) 7.5 MG tablet Left foot pain - Plan: meloxicam (MOBIC) 7.5 MG tablet Neck pain on left side  -  Left arm symptoms reproducible with cervical range of motion and based on description appear to be cervical radiculopathy. No weakness, reflexes appear equal. Left thumb and left foot pain possible degenerative changes within those respective joints.  -Trial of meloxicam 7.5 mg daily, 5 mg Flexeril 3 times a day when necessary, side effects discussed, RTC precautions if not improving.  Nonintractable episodic headache, unspecified headache type - Plan: CANCELED: Ambulatory referral to Neurology  -May be related to cervical radiculopathy and neck pain. If not improving with meloxicam, Consider referral to neuro.  RBBB Palpitations - Plan: EKG 12-Lead, CBC, Basic metabolic panel, Ambulatory referral to Cardiology, CANCELED: Ambulatory referral to Cardiology  - Intermittent. Reassuring EKG except right bundle branch block without previous EKG for comparison. Check CBC, BMP, refer to cardiology. RTC/ER precautions if worsening  Hyperthyroidism  -Recently stable levels.  Less likely cause of palpitations  Elevated blood pressure reading - Plan: EKG 12-Lead  -Improved on recheck   Meds ordered this encounter  Medications  . meloxicam (MOBIC) 7.5 MG tablet    Sig: Take 1 tablet (7.5 mg total) by mouth daily.    Dispense:  30 tablet    Refill:  0  . DISCONTD: cyclobenzaprine (FLEXERIL) 5 MG tablet    Sig: Take 1 tablet (5 mg total) by mouth at bedtime. 1 pill by mouth up to every 8 hours as needed. Start with one pill by mouth each bedtime as needed due to sedation    Dispense:  15 tablet    Refill:  0  . cyclobenzaprine (FLEXERIL) 5 MG tablet    Sig: Take 1 tablet (5 mg total) by mouth 3 (three) times daily as needed for muscle spasms.    Dispense:  15 tablet    Refill:  0   Patient Instructions    Meloxicam once per day for neck pain and cervical radiculopathy. That should also help your hand and foot pain. Recheck in 1 week, and possible x-ray at that time.  Headaches may improve with  treatment of neck pain. If headaches continue next visit, I can refer you to a neurologist. Return to the clinic or go to the nearest emergency room if any of your symptoms worsen or new symptoms occur.  I will check a blood count and electrolytes for your heart palpitations, but as those have been going on for some time, I would like you to meet with a cardiologist. If you have chest pain, shortness of breath, or worsening symptoms with this heart palpitations, would recommend evaluation to the emergency room or call 911 if needed.     Cervical Radiculopathy Cervical radiculopathy happens when a nerve in the neck (cervical nerve) is pinched or bruised. This condition can develop because of an injury or as part of the normal aging process. Pressure on the cervical nerves can cause pain or numbness that runs from the neck all the way down into the arm and fingers. Usually, this condition gets better with rest. Treatment may be needed if the condition does not improve. What are the causes? This condition may be caused by:  Injury.  Slipped (herniated) disk.  Muscle tightness in the neck because of overuse.  Arthritis.  Breakdown or degeneration in the bones and joints of the spine (spondylosis) due to aging.  Bone spurs that may develop near the cervical nerves. What are the signs or symptoms? Symptoms of this condition include:  Pain that runs from the neck to the arm and hand. The pain can be severe or irritating. It may be worse when the neck is moved.  Numbness or weakness in the affected arm and hand. How is this diagnosed? This condition may be diagnosed based on symptoms, medical history, and a physical exam. You may also have tests, including:  X-rays.  CT scan.  MRI.  Electromyogram (EMG).  Nerve conduction tests. How is this treated? In many cases, treatment is not needed for this condition. With rest, the condition usually gets better over time. If treatment is  needed, options may include:  Wearing a soft neck collar for short periods of time.  Physical therapy to strengthen your neck muscles.  Medicines, such as NSAIDs, oral corticosteroids, or spinal injections.  Surgery. This may be needed if other treatments do not help. Various types of surgery may be done depending on the cause of your  problems. Follow these instructions at home: Managing pain   Take over-the-counter and prescription medicines only as told by your health care provider.  If directed, apply ice to the affected area.  Put ice in a plastic bag.  Place a towel between your skin and the bag.  Leave the ice on for 20 minutes, 2-3 times per day.  If ice does not help, you can try using heat. Take a warm shower or warm bath, or use a heat pack as told by your health care provider.  Try a gentle neck and shoulder massage to help relieve symptoms. Activity   Rest as needed. Follow instructions from your health care provider about any restrictions on activities.  Do stretching and strengthening exercises as told by your health care provider or physical therapist. General instructions   If you were given a soft collar, wear it as told by your health care provider.  Use a flat pillow when you sleep.  Keep all follow-up visits as told by your health care provider. This is important. Contact a health care provider if:  Your condition does not improve with treatment. Get help right away if:  Your pain gets much worse and cannot be controlled with medicines.  You have weakness or numbness in your hand, arm, face, or leg.  You have a high fever.  You have a stiff, rigid neck.  You lose control of your bowels or your bladder (have incontinence).  You have trouble with walking, balance, or speaking. This information is not intended to replace advice given to you by your health care provider. Make sure you discuss any questions you have with your health care  provider. Document Released: 10/22/2000 Document Revised: 07/05/2015 Document Reviewed: 03/23/2014 Elsevier Interactive Patient Education  2017 [employer].  Palpitations A palpitation is the feeling that your heartbeat is irregular or is faster than normal. It may feel like your heart is fluttering or skipping a beat. Palpitations are usually not a serious problem. They may be caused by many things, including smoking, caffeine, alcohol, stress, and certain medicines. Although most causes of palpitations are not serious, palpitations can be a sign of a serious medical problem. In some cases, you may need further medical evaluation. Follow these instructions at home: Pay attention to any changes in your symptoms. Take these actions to help with your condition:  Avoid the following:  Caffeinated coffee, tea, soft drinks, diet pills, and energy drinks.  Chocolate.  Alcohol.  Do not use any tobacco products, such as cigarettes, chewing tobacco, and e-cigarettes. If you need help quitting, ask your health care provider.  Try to reduce your stress and anxiety. Things that can help you relax include:  Yoga.  Meditation.  Physical activity, such as swimming, jogging, or walking.  Biofeedback. This is a method that helps you learn to use your mind to control things in your body, such as your heartbeats.  Get plenty of rest and sleep.  Take over-the-counter and prescription medicines only as told by your health care provider.  Keep all follow-up visits as told by your health care provider. This is important. Contact a health care provider if:  You continue to have a fast or irregular heartbeat after 24 hours.  Your palpitations occur more often. Get help right away if:  You have chest pain or shortness of breath.  You have a severe headache.  You feel dizzy or you faint. This information is not intended to replace advice given to you by your  health care provider. Make sure you  discuss any questions you have with your health care provider. Document Released: 01/25/2000 Document Revised: 07/02/2015 Document Reviewed: 10/12/2014 Elsevier Interactive Patient Education  2017 [employer].    IF you received an x-ray today, you will receive an invoice from [facility]. Please contact [facility] at [phone] with questions or concerns regarding your invoice.   IF you received labwork today, you will receive an invoice from [place]. Please contact [facility] at [phone] with questions or concerns regarding your invoice.   Our billing staff will not be able to assist you with questions regarding bills from these companies.  You will be contacted with the lab results as soon as they are available. The fastest way to get your results is to activate your My Chart account. Instructions are located on the last page of this paperwork. If you have not heard from [place] regarding the results in 2 weeks, please contact this office.       I personally performed the services described in this documentation, which was scribed in my presence. The recorded information has been reviewed and considered for accuracy and completeness, addended by me as needed, and agree with information above.  Signed,   Meredith Staggers, MD Primary Care at [facility].  05/22/16 10:31 PM

## 2016-05-20 NOTE — Patient Instructions (Addendum)
Meloxicam once per day for neck pain and cervical radiculopathy. That should also help your hand and foot pain. Recheck in 1 week, and possible x-ray at that time.  Headaches may improve with treatment of neck pain. If headaches continue next visit, I can refer you to a neurologist. Return to the clinic or go to the nearest emergency room if any of your symptoms worsen or new symptoms occur.  I will check a blood count and electrolytes for your heart palpitations, but as those have been going on for some time, I would like you to meet with a cardiologist. If you have chest pain, shortness of breath, or worsening symptoms with this heart palpitations, would recommend evaluation to the emergency room or call 911 if needed.     Cervical Radiculopathy Cervical radiculopathy happens when a nerve in the neck (cervical nerve) is pinched or bruised. This condition can develop because of an injury or as part of the normal aging process. Pressure on the cervical nerves can cause pain or numbness that runs from the neck all the way down into the arm and fingers. Usually, this condition gets better with rest. Treatment may be needed if the condition does not improve. What are the causes? This condition may be caused by:  Injury.  Slipped (herniated) disk.  Muscle tightness in the neck because of overuse.  Arthritis.  Breakdown or degeneration in the bones and joints of the spine (spondylosis) due to aging.  Bone spurs that may develop near the cervical nerves. What are the signs or symptoms? Symptoms of this condition include:  Pain that runs from the neck to the arm and hand. The pain can be severe or irritating. It may be worse when the neck is moved.  Numbness or weakness in the affected arm and hand. How is this diagnosed? This condition may be diagnosed based on symptoms, medical history, and a physical exam. You may also have tests, including:  X-rays.  CT  scan.  MRI.  Electromyogram (EMG).  Nerve conduction tests. How is this treated? In many cases, treatment is not needed for this condition. With rest, the condition usually gets better over time. If treatment is needed, options may include:  Wearing a soft neck collar for short periods of time.  Physical therapy to strengthen your neck muscles.  Medicines, such as NSAIDs, oral corticosteroids, or spinal injections.  Surgery. This may be needed if other treatments do not help. Various types of surgery may be done depending on the cause of your problems. Follow these instructions at home: Managing pain   Take over-the-counter and prescription medicines only as told by your health care provider.  If directed, apply ice to the affected area.  Put ice in a plastic bag.  Place a towel between your skin and the bag.  Leave the ice on for 20 minutes, 2-3 times per day.  If ice does not help, you can try using heat. Take a warm shower or warm bath, or use a heat pack as told by your health care provider.  Try a gentle neck and shoulder massage to help relieve symptoms. Activity   Rest as needed. Follow instructions from your health care provider about any restrictions on activities.  Do stretching and strengthening exercises as told by your health care provider or physical therapist. General instructions   If you were given a soft collar, wear it as told by your health care provider.  Use a flat pillow when you sleep.  Keep  all follow-up visits as told by your health care provider. This is important. Contact a health care provider if:  Your condition does not improve with treatment. Get help right away if:  Your pain gets much worse and cannot be controlled with medicines.  You have weakness or numbness in your hand, arm, face, or leg.  You have a high fever.  You have a stiff, rigid neck.  You lose control of your bowels or your bladder (have incontinence).  You  have trouble with walking, balance, or speaking. This information is not intended to replace advice given to you by your health care provider. Make sure you discuss any questions you have with your health care provider. Document Released: 10/22/2000 Document Revised: 07/05/2015 Document Reviewed: 03/23/2014 Elsevier Interactive Patient Education  2017 [employer].  Palpitations A palpitation is the feeling that your heartbeat is irregular or is faster than normal. It may feel like your heart is fluttering or skipping a beat. Palpitations are usually not a serious problem. They may be caused by many things, including smoking, caffeine, alcohol, stress, and certain medicines. Although most causes of palpitations are not serious, palpitations can be a sign of a serious medical problem. In some cases, you may need further medical evaluation. Follow these instructions at home: Pay attention to any changes in your symptoms. Take these actions to help with your condition:  Avoid the following:  Caffeinated coffee, tea, soft drinks, diet pills, and energy drinks.  Chocolate.  Alcohol.  Do not use any tobacco products, such as cigarettes, chewing tobacco, and e-cigarettes. If you need help quitting, ask your health care provider.  Try to reduce your stress and anxiety. Things that can help you relax include:  Yoga.  Meditation.  Physical activity, such as swimming, jogging, or walking.  Biofeedback. This is a method that helps you learn to use your mind to control things in your body, such as your heartbeats.  Get plenty of rest and sleep.  Take over-the-counter and prescription medicines only as told by your health care provider.  Keep all follow-up visits as told by your health care provider. This is important. Contact a health care provider if:  You continue to have a fast or irregular heartbeat after 24 hours.  Your palpitations occur more often. Get help right away if:  You  have chest pain or shortness of breath.  You have a severe headache.  You feel dizzy or you faint. This information is not intended to replace advice given to you by your health care provider. Make sure you discuss any questions you have with your health care provider. Document Released: 01/25/2000 Document Revised: 07/02/2015 Document Reviewed: 10/12/2014 Elsevier Interactive Patient Education  2017 [employer].    IF you received an x-ray today, you will receive an invoice from [facility]. Please contact [facility] at [phone] with questions or concerns regarding your invoice.   IF you received labwork today, you will receive an invoice from [place]. Please contact [facility] at [phone] with questions or concerns regarding your invoice.   Our billing staff will not be able to assist you with questions regarding bills from these companies.  You will be contacted with the lab results as soon as they are available. The fastest way to get your results is to activate your My Chart account. Instructions are located on the last page of this paperwork. If you have not heard from [place] regarding the results in 2 weeks, please contact this office.

## 2016-05-21 LAB — CBC
HEMATOCRIT: 42.5 % (ref 37.5–51.0)
Hemoglobin: 14.2 g/dL (ref 13.0–17.7)
MCH: 26 pg — ABNORMAL LOW (ref 26.6–33.0)
MCHC: 33.4 g/dL (ref 31.5–35.7)
MCV: 78 fL — ABNORMAL LOW (ref 79–97)
PLATELETS: 319 10*3/uL (ref 150–379)
RBC: 5.46 x10E6/uL (ref 4.14–5.80)
RDW: 13.9 % (ref 12.3–15.4)
WBC: 5.1 10*3/uL (ref 3.4–10.8)

## 2016-05-21 LAB — BASIC METABOLIC PANEL
BUN/Creatinine Ratio: 12 (ref 9–20)
BUN: 9 mg/dL (ref 6–24)
CALCIUM: 9.4 mg/dL (ref 8.7–10.2)
CO2: 27 mmol/L (ref 18–29)
Chloride: 102 mmol/L (ref 96–106)
Creatinine, Ser: 0.77 mg/dL (ref 0.76–1.27)
GFR calc Af Amer: 118 mL/min/{1.73_m2} (ref >59)
GFR, EST NON AFRICAN AMERICAN: 102 mL/min/{1.73_m2} (ref >59)
Glucose: 121 mg/dL — ABNORMAL HIGH (ref 65–99)
Potassium: 3.9 mmol/L (ref 3.5–5.2)
Sodium: 143 mmol/L (ref 134–144)

## 2016-05-29 ENCOUNTER — Ambulatory Visit (INDEPENDENT_AMBULATORY_CARE_PROVIDER_SITE_OTHER): Payer: BLUE CROSS/BLUE SHIELD | Admitting: Family Medicine

## 2016-05-29 ENCOUNTER — Encounter: Payer: Self-pay | Admitting: Family Medicine

## 2016-05-29 VITALS — BP 136/82 | HR 69 | Temp 97.7°F | Resp 20 | Ht 73.62 in | Wt 206.0 lb

## 2016-05-29 DIAGNOSIS — R51 Headache: Secondary | ICD-10-CM | POA: Diagnosis not present

## 2016-05-29 DIAGNOSIS — H579 Unspecified disorder of eye and adnexa: Secondary | ICD-10-CM

## 2016-05-29 DIAGNOSIS — R519 Headache, unspecified: Secondary | ICD-10-CM

## 2016-05-29 DIAGNOSIS — M5412 Radiculopathy, cervical region: Secondary | ICD-10-CM | POA: Diagnosis not present

## 2016-05-29 MED ORDER — CYCLOBENZAPRINE HCL 5 MG PO TABS: 5.0000 mg | ORAL_TABLET | Freq: Three times a day (TID) | ORAL | 0 refills | Status: DC | PRN

## 2016-05-29 NOTE — Patient Instructions (Addendum)
  Continue meloxicam once per day. Start Flexeril up to every 8 hours. Start at bedtime as it causes sedation. I will order a CAT scan, but also refer you to headache specialist for your headaches. Return here or the emergency room if acutely worsening. Also call your eye care provider for appointment within the next 1 week to make sure the headache is not due to an eye problem. If any change in your vision sooner, return here or the emergency room.   IF you received an x-ray today, you will receive an invoice from [facility]. Please contact [facility] at [phone] with questions or concerns regarding your invoice.   IF you received labwork today, you will receive an invoice from [place]. Please contact [facility] at [phone] with questions or concerns regarding your invoice.   Our billing staff will not be able to assist you with questions regarding bills from these companies.  You will be contacted with the lab results as soon as they are available. The fastest way to get your results is to activate your My Chart account. Instructions are located on the last page of this paperwork. If you have not heard from [place] regarding the results in 2 weeks, please contact this office.

## 2016-05-29 NOTE — Progress Notes (Signed)
By signing my name below, I, Mesha Guinyard, attest that this documentation has been prepared under the direction and in the presence of Meredith Staggers, MD.  Electronically Signed: Arvilla Market, Medical Scribe. 05/29/16. 12:24 PM.  Subjective:    Patient ID: Vincent Gentry, male    DOB: 1960-05-07, 56 y.o.   MRN: [id number]  HPI Chief Complaint  Patient presents with  . Follow-up    left cervical    HPI Comments: Vincent Gentry is a 56 y.o. male who presents to the Primary Care at [facility] for left cervical radiculopathy and HA follow-up. See last visit on 05/20/16, he was also complaining of left foot and left thumb pain. He started on meloxicam 500 m gQD, flexeril 5 mg TIB/PRN. He had nl reflex and strength at last visit. He was experiencing HAs intermittently 3 weeks prior. Treated with tylenol 300 mg BID. Thought that HAs were related in part due to the cervical sxs.  Pt is taking meloxicam QD, and isn't taking flexeril. Reports his neck and arm pain is feeling better than before, but he still has radiating pain down his arm to his thumb and his HAs are worsening. Pt's HAs, with occasional left eye pain, are more frequentwith daily episodes for the past month and they last all day - had HA this morning but not currently. He had a severe top left HA 4 and 5 days ago and took 1-2 tylenol with relief of his sxs. Pt noticed occasional pressure sensation in his left eye that started the same time as HA onset. He last saw an ophthalomolgist in the past year but plans on scheduling an appt tomorrow - no changes in his eye health. Denies vision changes, N/V, unsteadiness, dizziness, facial asymmetry, numbness, or speech difficulty.  Heart Palpitations: See last visit. Intermittent. Noted to have RBBB on EKG. Recent nl TSH, CBC, and BM obtained at last visit. Mild elevated glucose 121, CBC over all reassuring. Referred to cardiology. Pt has not received a call from the  cardiologist.  Patient Active Problem List   Diagnosis Date Noted  . Hyperthyroidism 07/26/2013  . History of kidney stones 06/20/2013   Past Medical History:  Diagnosis Date  . Kidney stone   . Thyroid disease    Past Surgical History:  Procedure Laterality Date  . kidney stone removal     No Known Allergies Prior to Admission medications   Medication Sig Start Date End Date Taking? Authorizing Provider  cyclobenzaprine (FLEXERIL) 5 MG tablet Take 1 tablet (5 mg total) by mouth 3 (three) times daily as needed for muscle spasms. 05/20/16   Shade Flood, MD  meloxicam (MOBIC) 7.5 MG tablet Take 1 tablet (7.5 mg total) by mouth daily. 05/20/16   Shade Flood, MD   Social History   Social History  . Marital status: Married    Spouse name: N/A  . Number of children: N/A  . Years of education: N/A   Occupational History  . Not on file.   Social History Main Topics  . Smoking status: Former Games developer  . Smokeless tobacco: Never Used  . Alcohol use No  . Drug use: No  . Sexual activity: Not on file   Other Topics Concern  . Not on file   Social History Narrative   From the [place] -    Review of Systems  Eyes: Positive for pain. Negative for visual disturbance.  Gastrointestinal: Negative for nausea and vomiting.  Musculoskeletal:  Positive for neck pain. Negative for gait problem.  Neurological: Positive for headaches. Negative for dizziness, facial asymmetry, speech difficulty and weakness.   Objective:  Physical Exam  Constitutional: He appears well-developed and well-nourished. No distress.  HENT:  Head: Normocephalic and atraumatic.  Eyes: Conjunctivae are normal.  Neck: Neck supple.  Cardiovascular: Normal rate.   Pulmonary/Chest: Effort normal.  Musculoskeletal:  Spasms with slight tenderness in left paraspinal of left cervical spine Pain reproducilbe with neck flexion Pain that radiation down anterior left arm Radiates down to the thumb    Neurological: He is alert. He displays a negative Romberg sign.  Nl heel to toe Nl finger to nose No pronator drift Equal strength upper and lower extremities Equal facial movements  Skin: Skin is warm and dry.  Psychiatric: He has a normal mood and affect. His behavior is normal.  Nursing note and vitals reviewed.   Vitals:   05/29/16 1134  BP: 136/82  Pulse: 69  Resp: 20  Temp: 97.7 F (36.5 C)  TempSrc: Oral  SpO2: 97%  Weight: 206 lb (93.4 kg)  Height: 6' 1.62" (1.87 m)  Body mass index is 26.72 kg/m. Assessment & Plan:   Vincent Gentry is a 56 y.o. male Daily headache - Plan: CT Head Wo Contrast, Ambulatory referral to Neurology  Left cervical radiculopathy - Plan: cyclobenzaprine (FLEXERIL) 5 MG tablet  Eye pressure - Plan: CT Head Wo Contrast  New-onset daily headache, may be related to left cervical radiculopathy, but without improvement in persistent headache and has had some improvement with radicular pain  -will check head CT, refer to neurology. Also recommended discussing eye pressure and exam including intraocular pressure testing with eye care provider as soon as possible  -Trial of addition of Flexeril 5 mg 3 times a day when necessary, and continue Mobic  -ER/RTC precautions discussed.  Meds ordered this encounter  Medications  . cyclobenzaprine (FLEXERIL) 5 MG tablet    Sig: Take 1 tablet (5 mg total) by mouth 3 (three) times daily as needed for muscle spasms.    Dispense:  15 tablet    Refill:  0   Patient Instructions    Continue meloxicam once per day. Start Flexeril up to every 8 hours. Start at bedtime as it causes sedation. I will order a CAT scan, but also refer you to headache specialist for your headaches. Return here or the emergency room if acutely worsening. Also call your eye care provider for appointment within the next 1 week to make sure the headache is not due to an eye problem. If any change in your vision sooner, return here or the  emergency room.   IF you received an x-ray today, you will receive an invoice from [facility]. Please contact [facility] at [phone] with questions or concerns regarding your invoice.   IF you received labwork today, you will receive an invoice from [facility]. Please contact [facility] at [phone] with questions or concerns regarding your invoice.   Our billing staff will not be able to assist you with questions regarding bills from these companies.  You will be contacted with the lab results as soon as they are available. The fastest way to get your results is to activate your My Chart account. Instructions are located on the last page of this paperwork. If you have not heard from [place] regarding the results in 2 weeks, please contact this office.       I personally performed the services described in this  documentation, which was scribed in my presence. The recorded information has been reviewed and considered for accuracy and completeness, addended by me as needed, and agree with information above.  Signed,   Meredith Staggers, MD Primary Care at [facility].  05/31/16 6:00 PM

## 2016-06-19 ENCOUNTER — Encounter: Payer: Self-pay | Admitting: Family Medicine

## 2016-06-19 ENCOUNTER — Ambulatory Visit (INDEPENDENT_AMBULATORY_CARE_PROVIDER_SITE_OTHER): Payer: BLUE CROSS/BLUE SHIELD | Admitting: Family Medicine

## 2016-06-19 VITALS — BP 128/74 | HR 97 | Temp 99.3°F | Resp 16 | Ht 72.5 in | Wt 203.6 lb

## 2016-06-19 DIAGNOSIS — G44219 Episodic tension-type headache, not intractable: Secondary | ICD-10-CM

## 2016-06-19 DIAGNOSIS — M5412 Radiculopathy, cervical region: Secondary | ICD-10-CM | POA: Diagnosis not present

## 2016-06-19 NOTE — Patient Instructions (Addendum)
As we discussed, I would consider prednisone to help your neck further. However if you want to wait another 2 weeks to see if the meloxicam will continue to improve it as well as Flexeril up to every 8 hours, that is fine. If any worsening of pain, return sooner for possible x-rays, and blood work if we are considering prednisone. We can also check an MRI if needed. If headaches return, follow-up with headache specialist.  Return to the clinic or go to the nearest emergency room if any of your symptoms worsen or new symptoms occur.   Cervical Radiculopathy Cervical radiculopathy happens when a nerve in the neck (cervical nerve) is pinched or bruised. This condition can develop because of an injury or as part of the normal aging process. Pressure on the cervical nerves can cause pain or numbness that runs from the neck all the way down into the arm and fingers. Usually, this condition gets better with rest. Treatment may be needed if the condition does not improve. What are the causes? This condition may be caused by:  Injury.  Slipped (herniated) disk.  Muscle tightness in the neck because of overuse.  Arthritis.  Breakdown or degeneration in the bones and joints of the spine (spondylosis) due to aging.  Bone spurs that may develop near the cervical nerves. What are the signs or symptoms? Symptoms of this condition include:  Pain that runs from the neck to the arm and hand. The pain can be severe or irritating. It may be worse when the neck is moved.  Numbness or weakness in the affected arm and hand. How is this diagnosed? This condition may be diagnosed based on symptoms, medical history, and a physical exam. You may also have tests, including:  X-rays.  CT scan.  MRI.  Electromyogram (EMG).  Nerve conduction tests. How is this treated? In many cases, treatment is not needed for this condition. With rest, the condition usually gets better over time. If treatment is needed,  options may include:  Wearing a soft neck collar for short periods of time.  Physical therapy to strengthen your neck muscles.  Medicines, such as NSAIDs, oral corticosteroids, or spinal injections.  Surgery. This may be needed if other treatments do not help. Various types of surgery may be done depending on the cause of your problems. Follow these instructions at home: Managing pain   Take over-the-counter and prescription medicines only as told by your health care provider.  If directed, apply ice to the affected area.  Put ice in a plastic bag.  Place a towel between your skin and the bag.  Leave the ice on for 20 minutes, 2-3 times per day.  If ice does not help, you can try using heat. Take a warm shower or warm bath, or use a heat pack as told by your health care provider.  Try a gentle neck and shoulder massage to help relieve symptoms. Activity   Rest as needed. Follow instructions from your health care provider about any restrictions on activities.  Do stretching and strengthening exercises as told by your health care provider or physical therapist. General instructions   If you were given a soft collar, wear it as told by your health care provider.  Use a flat pillow when you sleep.  Keep all follow-up visits as told by your health care provider. This is important. Contact a health care provider if:  Your condition does not improve with treatment. Get help right away if:  Your  pain gets much worse and cannot be controlled with medicines.  You have weakness or numbness in your hand, arm, face, or leg.  You have a high fever.  You have a stiff, rigid neck.  You lose control of your bowels or your bladder (have incontinence).  You have trouble with walking, balance, or speaking. This information is not intended to replace advice given to you by your health care provider. Make sure you discuss any questions you have with your health care provider. Document  Released: 10/22/2000 Document Revised: 07/05/2015 Document Reviewed: 03/23/2014 Elsevier Interactive Patient Education  2017 [employer]    IF you received an x-ray today, you will receive an invoice from [facility]. Please contact [facility] at [phone] with questions or concerns regarding your invoice.   IF you received labwork today, you will receive an invoice from [facility]. Please contact [facility] at [phone] with questions or concerns regarding your invoice.   Our billing staff will not be able to assist you with questions regarding bills from these companies.  You will be contacted with the lab results as soon as they are available. The fastest way to get your results is to activate your My Chart account. Instructions are located on the last page of this paperwork. If you have not heard from us regarding the results in 2 weeks, please contact this office.

## 2016-06-19 NOTE — Progress Notes (Signed)
Subjective:  By signing my name below, I, Stann Ore, attest that this documentation has been prepared under the direction and in the presence of Meredith Staggers, MD. Electronically Signed: Stann Ore, Scribe. 06/19/2016 , 5:21 PM .  Patient was seen in Room 27 .   Patient ID: Vincent Gentry, male    DOB: 03/20/1960, 56 y.o.   MRN: [id number] Chief Complaint  Patient presents with  . Headache    per pt is "completely gone away"  . Left neck radiculopathy    not gone away, but "better"  . numbness down arm and thumb    "better"   HPI Vincent Gentry is a 56 y.o. male Here for follow up of left neck radiculopathy and headache. See previous visits on April 10th and April 19th. He was treated with meloxicam, flexeril, and tylenol. He was improving at last visit, but still had some symptoms, so added flexeril as he had not started it yet.   Patient informs his headache is completely resolved. He hasn't felt any headaches since last visit. However, his neck pain is still present, but improved. He's taking flexeril as needed and meloxicam daily. He rates his neck pain has improved to 40%. He also still has pain radiating down his left arm and into his thumb, but this has also improved. He denies fever, photophobia or phonophobia. 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   He was seen by Dr. Leslie Dales for goiter on April 10th; plan for monitoring.   Patient Active Problem List   Diagnosis Date Noted  . Hyperthyroidism 07/26/2013  . History of kidney stones 06/20/2013   Past Medical History:  Diagnosis Date  . Kidney stone   . Thyroid disease    Past Surgical History:  Procedure Laterality Date  . kidney stone removal     Not on File Prior to Admission medications   Medication Sig Start Date End Date Taking? Authorizing Provider  cyclobenzaprine (FLEXERIL) 5 MG tablet Take 1 tablet (5 mg total) by mouth 3 (three) times daily as needed for muscle spasms.  05/29/16   Shade Flood, MD  meloxicam (MOBIC) 7.5 MG tablet Take 1 tablet (7.5 mg total) by mouth daily. 05/20/16   Shade Flood, MD   Social History   Social History  . Marital status: Married    Spouse name: N/A  . Number of children: N/A  . Years of education: N/A   Occupational History  . Not on file.   Social History Main Topics  . Smoking status: Former Games developer  . Smokeless tobacco: Never Used  . Alcohol use No  . Drug use: No  . Sexual activity: Not on file   Other Topics Concern  . Not on file   Social History Narrative   From the [place] -    Review of Systems  Constitutional: Negative for fatigue, fever and unexpected weight change.  Eyes: Negative for photophobia and visual disturbance.  Respiratory: Negative for cough, chest tightness and shortness of breath.   Cardiovascular: Negative for chest pain, palpitations and leg swelling.  Gastrointestinal: Negative for abdominal pain and blood in stool.  Musculoskeletal: Positive for myalgias and neck pain.  Neurological: Negative for dizziness, light-headedness and headaches.       Objective:   Physical Exam  Constitutional: He is oriented to person, place, and time. He appears well-developed and well-nourished. No distress.  HENT:  Head: Normocephalic and atraumatic.  Eyes: EOM are normal. Pupils  are equal, round, and reactive to light.  Neck: Neck supple.  Cardiovascular: Normal rate.   Pulmonary/Chest: Effort normal. No respiratory distress.  Musculoskeletal: Normal range of motion.  C-spine: rotations equal, decreased extension, left lateral flexion slightly decrease and reproducible symptoms with radiating pain down to his left thumb.  Has spasms over left paraspinals into the left trapezius  Neurological: He is alert and oriented to person, place, and time.  Reflex Scores:      Tricep reflexes are 2+ on the right side and 2+ on the left side.      Bicep reflexes are 2+ on the right side and 2+  on the left side.      Brachioradialis reflexes are 2+ on the right side and 2+ on the left side. Upper extremities strength 5/5  Skin: Skin is warm and dry.  Psychiatric: He has a normal mood and affect. His behavior is normal.  Nursing note and vitals reviewed.   Vitals:   06/19/16 1606  BP: 128/74  Pulse: 97  Resp: 16  Temp: 99.3 F (37.4 C)  TempSrc: Oral  SpO2: 97%  Weight: 203 lb 9.6 oz (92.4 kg)  Height: 6' 0.5" (1.842 m)      Assessment & Plan:    Vincent Gentry is a 56 y.o. male Episodic tension-type headache, not intractable  Left cervical radiculopathy  - Headache resolved, left cervical radiculopathy still present, but improving. Discussed prednisone trial versus MRI, but he would prefer to continue meloxicam, Flexeril if needed and recheck if not continuing to improve in the next few weeks. RTC precautions if worsening sooner.  No orders of the defined types were placed in this encounter.  Patient Instructions   As we discussed, I would consider prednisone to help your neck further. However if you want to wait another 2 weeks to see if the meloxicam will continue to improve it as well as Flexeril up to every 8 hours, that is fine. If any worsening of pain, return sooner for possible x-rays, and blood work if we are considering prednisone. We can also check an MRI if needed. If headaches return, follow-up with headache specialist.  Return to the clinic or go to the nearest emergency room if any of your symptoms worsen or new symptoms occur.   Cervical Radiculopathy Cervical radiculopathy happens when a nerve in the neck (cervical nerve) is pinched or bruised. This condition can develop because of an injury or as part of the normal aging process. Pressure on the cervical nerves can cause pain or numbness that runs from the neck all the way down into the arm and fingers. Usually, this condition gets better with rest. Treatment may be needed if the condition does not  improve. What are the causes? This condition may be caused by:  Injury.  Slipped (herniated) disk.  Muscle tightness in the neck because of overuse.  Arthritis.  Breakdown or degeneration in the bones and joints of the spine (spondylosis) due to aging.  Bone spurs that may develop near the cervical nerves. What are the signs or symptoms? Symptoms of this condition include:  Pain that runs from the neck to the arm and hand. The pain can be severe or irritating. It may be worse when the neck is moved.  Numbness or weakness in the affected arm and hand. How is this diagnosed? This condition may be diagnosed based on symptoms, medical history, and a physical exam. You may also have tests, including:  X-rays.  CT scan.  MRI.  Electromyogram (EMG).  Nerve conduction tests. How is this treated? In many cases, treatment is not needed for this condition. With rest, the condition usually gets better over time. If treatment is needed, options may include:  Wearing a soft neck collar for short periods of time.  Physical therapy to strengthen your neck muscles.  Medicines, such as NSAIDs, oral corticosteroids, or spinal injections.  Surgery. This may be needed if other treatments do not help. Various types of surgery may be done depending on the cause of your problems. Follow these instructions at home: Managing pain   Take over-the-counter and prescription medicines only as told by your health care provider.  If directed, apply ice to the affected area.  Put ice in a plastic bag.  Place a towel between your skin and the bag.  Leave the ice on for 20 minutes, 2-3 times per day.  If ice does not help, you can try using heat. Take a warm shower or warm bath, or use a heat pack as told by your health care provider.  Try a gentle neck and shoulder massage to help relieve symptoms. Activity   Rest as needed. Follow instructions from your health care provider about any  restrictions on activities.  Do stretching and strengthening exercises as told by your health care provider or physical therapist. General instructions   If you were given a soft collar, wear it as told by your health care provider.  Use a flat pillow when you sleep.  Keep all follow-up visits as told by your health care provider. This is important. Contact a health care provider if:  Your condition does not improve with treatment. Get help right away if:  Your pain gets much worse and cannot be controlled with medicines.  You have weakness or numbness in your hand, arm, face, or leg.  You have a high fever.  You have a stiff, rigid neck.  You lose control of your bowels or your bladder (have incontinence).  You have trouble with walking, balance, or speaking. This information is not intended to replace advice given to you by your health care provider. Make sure you discuss any questions you have with your health care provider. Document Released: 10/22/2000 Document Revised: 07/05/2015 Document Reviewed: 03/23/2014 Elsevier Interactive Patient Education  2017 [employer]    IF you received an x-ray today, you will receive an invoice from [facility]. Please contact [facility] at [phone] with questions or concerns regarding your invoice.   IF you received labwork today, you will receive an invoice from [facility]. Please contact [facility] at [phone] with questions or concerns regarding your invoice.   Our billing staff will not be able to assist you with questions regarding bills from these companies.  You will be contacted with the lab results as soon as they are available. The fastest way to get your results is to activate your My Chart account. Instructions are located on the last page of this paperwork. If you have not heard from us regarding the results in 2 weeks, please contact this office.       I personally performed the services  described in this documentation, which was scribed in my presence. The recorded information has been reviewed and considered for accuracy and completeness, addended by me as needed, and agree with information above.  Signed,   Meredith StaggersJeffrey Tarell Schollmeyer, MD Primary Care at [facility].  06/21/16 5:28 PM

## 2016-06-24 ENCOUNTER — Ambulatory Visit: Payer: BLUE CROSS/BLUE SHIELD | Admitting: Family Medicine

## 2016-11-14 ENCOUNTER — Emergency Department (HOSPITAL_COMMUNITY): Payer: Self-pay

## 2016-11-14 ENCOUNTER — Emergency Department (HOSPITAL_COMMUNITY)
Admission: EM | Admit: 2016-11-14 | Discharge: 2016-11-14 | Disposition: A | Discharge status: Home / Self Care | Payer: Self-pay | Attending: Emergency Medicine | Admitting: Emergency Medicine

## 2016-11-14 ENCOUNTER — Encounter (HOSPITAL_COMMUNITY): Payer: Self-pay

## 2016-11-14 DIAGNOSIS — Y9389 Activity, other specified: Secondary | ICD-10-CM | POA: Insufficient documentation

## 2016-11-14 DIAGNOSIS — Y92410 Unspecified street and highway as the place of occurrence of the external cause: Secondary | ICD-10-CM | POA: Insufficient documentation

## 2016-11-14 DIAGNOSIS — M5441 Lumbago with sciatica, right side: Secondary | ICD-10-CM | POA: Insufficient documentation

## 2016-11-14 DIAGNOSIS — E039 Hypothyroidism, unspecified: Secondary | ICD-10-CM | POA: Insufficient documentation

## 2016-11-14 DIAGNOSIS — Y998 Other external cause status: Secondary | ICD-10-CM | POA: Insufficient documentation

## 2016-11-14 DIAGNOSIS — Z79899 Other long term (current) drug therapy: Secondary | ICD-10-CM | POA: Insufficient documentation

## 2016-11-14 DIAGNOSIS — R51 Headache: Secondary | ICD-10-CM | POA: Insufficient documentation

## 2016-11-14 DIAGNOSIS — S93401A Sprain of unspecified ligament of right ankle, initial encounter: Secondary | ICD-10-CM | POA: Insufficient documentation

## 2016-11-14 DIAGNOSIS — M79645 Pain in left finger(s): Secondary | ICD-10-CM

## 2016-11-14 DIAGNOSIS — M79672 Pain in left foot: Secondary | ICD-10-CM

## 2016-11-14 DIAGNOSIS — M25571 Pain in right ankle and joints of right foot: Secondary | ICD-10-CM

## 2016-11-14 DIAGNOSIS — Z87891 Personal history of nicotine dependence: Secondary | ICD-10-CM | POA: Insufficient documentation

## 2016-11-14 DIAGNOSIS — M5412 Radiculopathy, cervical region: Secondary | ICD-10-CM

## 2016-11-14 MED ORDER — DEXAMETHASONE SODIUM PHOSPHATE 10 MG/ML IJ SOLN
10.0000 mg | Freq: Once | INTRAMUSCULAR | Status: AC
  Administered 2016-11-14: 10 mg via INTRAMUSCULAR
  Filled 2016-11-14: qty 1

## 2016-11-14 MED ORDER — IBUPROFEN 400 MG PO TABS
600.0000 mg | ORAL_TABLET | Freq: Once | ORAL | Status: AC
  Administered 2016-11-14: 600 mg via ORAL
  Filled 2016-11-14: qty 1

## 2016-11-14 MED ORDER — CYCLOBENZAPRINE HCL 5 MG PO TABS: 5.0000 mg | ORAL_TABLET | Freq: Three times a day (TID) | ORAL | 0 refills | Status: DC | PRN

## 2016-11-14 MED ORDER — MELOXICAM 7.5 MG PO TABS: 7.5000 mg | ORAL_TABLET | Freq: Every day | ORAL | 0 refills | Status: DC

## 2016-11-14 MED ORDER — OXYCODONE-ACETAMINOPHEN 5-325 MG PO TABS
1.0000 | ORAL_TABLET | ORAL | Status: DC | PRN
  Administered 2016-11-14: 1 via ORAL

## 2016-11-14 MED ORDER — OXYCODONE-ACETAMINOPHEN 5-325 MG PO TABS
ORAL_TABLET | ORAL | Status: AC
  Filled 2016-11-14: qty 1

## 2016-11-14 NOTE — ED Notes (Signed)
Pt fitted with crutches and ace wrap to rt ankle
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
## 2016-11-14 NOTE — Discharge Instructions (Signed)
Your evaluation today is reassuring, injuries appear to be limited to your muscles and soft tissue. You can treat pain with home meloxicam and baclofen, ice and heat, and elevation. If symptoms in your ankle and lower back are not improving after about a week please follow-up with the orthopedic doctor. If you develop weakness, or numbness of extremities, or have difficulty controlling your bowel or bladder please return to the emergency department for repeat evaluation
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
## 2019-01-05 ENCOUNTER — Encounter: Payer: Self-pay | Admitting: Family Medicine

## 2019-01-05 ENCOUNTER — Other Ambulatory Visit: Payer: Self-pay

## 2019-01-05 ENCOUNTER — Ambulatory Visit (INDEPENDENT_AMBULATORY_CARE_PROVIDER_SITE_OTHER): Payer: Self-pay | Admitting: Family Medicine

## 2019-01-05 VITALS — BP 183/93 | HR 78 | Temp 98.4°F | Wt 209.8 lb

## 2019-01-05 DIAGNOSIS — R002 Palpitations: Secondary | ICD-10-CM

## 2019-01-05 DIAGNOSIS — E0590 Thyrotoxicosis, unspecified without thyrotoxic crisis or storm: Secondary | ICD-10-CM

## 2019-01-05 DIAGNOSIS — I10 Essential (primary) hypertension: Secondary | ICD-10-CM

## 2019-01-05 DIAGNOSIS — R000 Tachycardia, unspecified: Secondary | ICD-10-CM

## 2019-01-05 DIAGNOSIS — Z13220 Encounter for screening for lipoid disorders: Secondary | ICD-10-CM

## 2019-01-05 DIAGNOSIS — H6123 Impacted cerumen, bilateral: Secondary | ICD-10-CM

## 2019-01-05 DIAGNOSIS — R519 Headache, unspecified: Secondary | ICD-10-CM

## 2019-01-05 MED ORDER — AMLODIPINE BESYLATE 5 MG PO TABS: 5.0000 mg | ORAL_TABLET | Freq: Every day | ORAL | 1 refills | Status: DC

## 2019-01-05 NOTE — Patient Instructions (Addendum)
Decrease caffeine to 2/3 or 1/2 of current use to help with heart palpitations.  Start amlodipine 5mg  once per day for blood pressure, and recheck in 2 weeks with your home meter.  Try to avoid any added salt to food. See information below for heart palpitations and blood pressure.   Return to the clinic or go to the nearest emergency room if any of your symptoms worsen or new symptoms occur.   Palpitations Palpitations are feelings that your heartbeat is irregular or is faster than normal. It may feel like your heart is fluttering or skipping a beat. Palpitations are usually not a serious problem. They may be caused by many things, including smoking, caffeine, alcohol, stress, and certain medicines or drugs. Most causes of palpitations are not serious. However, some palpitations can be a sign of a serious problem. You may need further tests to rule out serious medical problems. Follow these instructions at home:     Pay attention to any changes in your condition. Take these actions to help manage your symptoms: Eating and drinking  Avoid foods and drinks that may cause palpitations. These may include: ? Caffeinated coffee, tea, soft drinks, diet pills, and energy drinks. ? Chocolate. ? Alcohol. Lifestyle  Take steps to reduce your stress and anxiety. Things that can help you relax include: ? Yoga. ? Mind-body activities, such as deep breathing, meditation, or using words and images to create positive thoughts (guided imagery). ? Physical activity, such as swimming, jogging, or walking. Tell your health care provider if your palpitations increase with activity. If you have chest pain or shortness of breath with activity, do not continue the activity until you are seen by your health care provider. ? Biofeedback. This is a method that helps you learn to use your mind to control things in your body, such as your heartbeat.  Do not use drugs, including cocaine or ecstasy. Do not use  marijuana.  Get plenty of rest and sleep. Keep a regular bed time. General instructions  Take over-the-counter and prescription medicines only as told by your health care provider.  Do not use any products that contain nicotine or tobacco, such as cigarettes and e-cigarettes. If you need help quitting, ask your health care provider.  Keep all follow-up visits as told by your health care provider. This is important. These may include visits for further testing if palpitations do not go away or get worse. Contact a health care provider if you:  Continue to have a fast or irregular heartbeat after 24 hours.  Notice that your palpitations occur more often. Get help right away if you:  Have chest pain or shortness of breath.  Have a severe headache.  Feel dizzy or you faint. Summary  Palpitations are feelings that your heartbeat is irregular or is faster than normal. It may feel like your heart is fluttering or skipping a beat.  Palpitations may be caused by many things, including smoking, caffeine, alcohol, stress, certain medicines, and drugs.  Although most causes of palpitations are not serious, some causes can be a sign of a serious medical problem.  Get help right away if you faint or have chest pain, shortness of breath, a severe headache, or dizziness. This information is not intended to replace advice given to you by your health care provider. Make sure you discuss any questions you have with your health care provider. Document Released: 01/25/2000 Document Revised: 03/11/2017 Document Reviewed: 03/11/2017 Elsevier Patient Education  2020 [place]  Your Hypertension Hypertension is commonly called high blood pressure. This is when the force of your blood pressing against the walls of your arteries is too strong. Arteries are blood vessels that carry blood from your heart throughout your body. Hypertension forces the heart to work harder to pump blood, and may  cause the arteries to become narrow or stiff. Having untreated or uncontrolled hypertension can cause heart attack, stroke, kidney disease, and other problems. What are blood pressure readings? A blood pressure reading consists of a higher number over a lower number. Ideally, your blood pressure should be below 120/80. The first ("top") number is called the systolic pressure. It is a measure of the pressure in your arteries as your heart beats. The second ("bottom") number is called the diastolic pressure. It is a measure of the pressure in your arteries as the heart relaxes. What does my blood pressure reading mean? Blood pressure is classified into four stages. Based on your blood pressure reading, your health care provider may use the following stages to determine what type of treatment you need, if any. Systolic pressure and diastolic pressure are measured in a unit called mm Hg. Normal  Systolic pressure: below 120.  Diastolic pressure: below 80. Elevated  Systolic pressure: 120-129.  Diastolic pressure: below 80. Hypertension stage 1  Systolic pressure: 130-139.  Diastolic pressure: 80-89. Hypertension stage 2  Systolic pressure: 140 or above.  Diastolic pressure: 90 or above. What health risks are associated with hypertension? Managing your hypertension is an important responsibility. Uncontrolled hypertension can lead to:  A heart attack.  A stroke.  A weakened blood vessel (aneurysm).  Heart failure.  Kidney damage.  Eye damage.  Metabolic syndrome.  Memory and concentration problems. What changes can I make to manage my hypertension? Hypertension can be managed by making lifestyle changes and possibly by taking medicines. Your health care provider will help you make a plan to bring your blood pressure within a normal range. Eating and drinking   Eat a diet that is high in fiber and potassium, and low in salt (sodium), added sugar, and fat. An example eating  plan is called the DASH (Dietary Approaches to Stop Hypertension) diet. To eat this way: ? Eat plenty of fresh fruits and vegetables. Try to fill half of your plate at each meal with fruits and vegetables. ? Eat whole grains, such as whole wheat pasta, brown rice, or whole grain bread. Fill about one quarter of your plate with whole grains. ? Eat low-fat diary products. ? Avoid fatty cuts of meat, processed or cured meats, and poultry with skin. Fill about one quarter of your plate with lean proteins such as fish, chicken without skin, beans, eggs, and tofu. ? Avoid premade and processed foods. These tend to be higher in sodium, added sugar, and fat.  Reduce your daily sodium intake. Most people with hypertension should eat less than 1,500 mg of sodium a day.  Limit alcohol intake to no more than 1 drink a day for nonpregnant women and 2 drinks a day for men. One drink equals 12 oz of beer, 5 oz of wine, or 1 oz of hard liquor. Lifestyle  Work with your health care provider to maintain a healthy body weight, or to lose weight. Ask what an ideal weight is for you.  Get at least 30 minutes of exercise that causes your heart to beat faster (aerobic exercise) most days of the week. Activities may include walking, swimming, or biking.  Include exercise  to strengthen your muscles (resistance exercise), such as weight lifting, as part of your weekly exercise routine. Try to do these types of exercises for 30 minutes at least 3 days a week.  Do not use any products that contain nicotine or tobacco, such as cigarettes and e-cigarettes. If you need help quitting, ask your health care provider.  Control any long-term (chronic) conditions you have, such as high cholesterol or diabetes. Monitoring  Monitor your blood pressure at home as told by your health care provider. Your personal target blood pressure may vary depending on your medical conditions, your age, and other factors.  Have your blood  pressure checked regularly, as often as told by your health care provider. Working with your health care provider  Review all the medicines you take with your health care provider because there may be side effects or interactions.  Talk with your health care provider about your diet, exercise habits, and other lifestyle factors that may be contributing to hypertension.  Visit your health care provider regularly. Your health care provider can help you create and adjust your plan for managing hypertension. Will I need medicine to control my blood pressure? Your health care provider may prescribe medicine if lifestyle changes are not enough to get your blood pressure under control, and if:  Your systolic blood pressure is 130 or higher.  Your diastolic blood pressure is 80 or higher. Take medicines only as told by your health care provider. Follow the directions carefully. Blood pressure medicines must be taken as prescribed. The medicine does not work as well when you skip doses. Skipping doses also puts you at risk for problems. Contact a health care provider if:  You think you are having a reaction to medicines you have taken.  You have repeated (recurrent) headaches.  You feel dizzy.  You have swelling in your ankles.  You have trouble with your vision. Get help right away if:  You develop a severe headache or confusion.  You have unusual weakness or numbness, or you feel faint.  You have severe pain in your chest or abdomen.  You vomit repeatedly.  You have trouble breathing. Summary  Hypertension is when the force of blood pumping through your arteries is too strong. If this condition is not controlled, it may put you at risk for serious complications.  Your personal target blood pressure may vary depending on your medical conditions, your age, and other factors. For most people, a normal blood pressure is less than 120/80.  Hypertension is managed by lifestyle changes,  medicines, or both. Lifestyle changes include weight loss, eating a healthy, low-sodium diet, exercising more, and limiting alcohol. This information is not intended to replace advice given to you by your health care provider. Make sure you discuss any questions you have with your health care provider. Document Released: 10/22/2011 Document Revised: 05/21/2018 Document Reviewed: 12/26/2015 Elsevier Patient Education  [employer].   If you have lab work done today you will be contacted with your lab results within the next 2 weeks.  If you have not heard from [place] then please contact us. The fastest way to get your results is to register for My Chart.   IF you received an x-ray today, you will receive an invoice from [facility]. Please contact [facility] at [phone] with questions or concerns regarding your invoice.   IF you received labwork today, you will receive an invoice from [facility]. Please contact [facility] at [phone] with questions or concerns regarding  your invoice.   Our billing staff will not be able to assist you with questions regarding bills from these companies.  You will be contacted with the lab results as soon as they are available. The fastest way to get your results is to activate your My Chart account. Instructions are located on the last page of this paperwork. If you have not heard from [place] regarding the results in 2 weeks, please contact this office.
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
## 2019-01-06 LAB — COMPREHENSIVE METABOLIC PANEL WITH GFR
ALT: 21 IU/L (ref 0–44)
AST: 21 IU/L (ref 0–40)
Albumin/Globulin Ratio: 1.2 (ref 1.2–2.2)
Albumin: 4.3 g/dL (ref 3.8–4.9)
Alkaline Phosphatase: 97 IU/L (ref 39–117)
BUN/Creatinine Ratio: 12 (ref 9–20)
BUN: 10 mg/dL (ref 6–24)
Bilirubin Total: 0.4 mg/dL (ref 0.0–1.2)
CO2: 21 mmol/L (ref 20–29)
Calcium: 9.5 mg/dL (ref 8.7–10.2)
Chloride: 103 mmol/L (ref 96–106)
Creatinine, Ser: 0.85 mg/dL (ref 0.76–1.27)
GFR calc Af Amer: 111 mL/min/1.73
GFR calc non Af Amer: 96 mL/min/1.73
Globulin, Total: 3.6 g/dL (ref 1.5–4.5)
Glucose: 95 mg/dL (ref 65–99)
Potassium: 4.6 mmol/L (ref 3.5–5.2)
Sodium: 139 mmol/L (ref 134–144)
Total Protein: 7.9 g/dL (ref 6.0–8.5)

## 2019-01-06 LAB — LIPID PANEL
Chol/HDL Ratio: 3.5 ratio (ref 0.0–5.0)
Cholesterol, Total: 136 mg/dL (ref 100–199)
HDL: 39 mg/dL — ABNORMAL LOW
LDL Chol Calc (NIH): 66 mg/dL (ref 0–99)
Triglycerides: 183 mg/dL — ABNORMAL HIGH (ref 0–149)
VLDL Cholesterol Cal: 31 mg/dL (ref 5–40)

## 2019-01-06 LAB — TSH+FREE T4
Free T4: 1.08 ng/dL (ref 0.82–1.77)
TSH: 0.349 u[IU]/mL — ABNORMAL LOW (ref 0.450–4.500)

## 2019-01-19 ENCOUNTER — Other Ambulatory Visit: Payer: Self-pay

## 2019-01-19 ENCOUNTER — Ambulatory Visit (INDEPENDENT_AMBULATORY_CARE_PROVIDER_SITE_OTHER): Payer: Self-pay | Admitting: Family Medicine

## 2019-01-19 VITALS — BP 142/84 | HR 85 | Temp 98.4°F | Resp 16 | Ht 73.0 in | Wt 210.0 lb

## 2019-01-19 DIAGNOSIS — I10 Essential (primary) hypertension: Secondary | ICD-10-CM

## 2019-01-19 DIAGNOSIS — E0590 Thyrotoxicosis, unspecified without thyrotoxic crisis or storm: Secondary | ICD-10-CM

## 2019-01-19 MED ORDER — AMLODIPINE BESYLATE 10 MG PO TABS: 10.0000 mg | ORAL_TABLET | Freq: Every day | ORAL | 1 refills | Status: DC

## 2019-01-19 NOTE — Patient Instructions (Addendum)
   Lab only visit in the next 1 month, then follow-up with me for an office visit in 3 months.  If thyroid test remains elevated, I may need to refer you to endocrinology.  Increase amlodipine to 10 mg once per day.  If any lightheadedness, dizziness, ankle swelling or other new side effects, please let me know.  Recheck in 3 months   If you have lab work done today you will be contacted with your lab results within the next 2 weeks.  If you have not heard from [place] then please contact us. The fastest way to get your results is to register for My Chart.   IF you received an x-ray today, you will receive an invoice from [facility]. Please contact [facility] at [phone] with questions or concerns regarding your invoice.   IF you received labwork today, you will receive an invoice from [place]. Please contact [facility] at [phone] with questions or concerns regarding your invoice.   Our billing staff will not be able to assist you with questions regarding bills from these companies.  You will be contacted with the lab results as soon as they are available. The fastest way to get your results is to activate your My Chart account. Instructions are located on the last page of this paperwork. If you have not heard from [place] regarding the results in 2 weeks, please contact this office.

## 2019-01-19 NOTE — Progress Notes (Signed)
Subjective:  Patient ID: Vincent Gentry, male    DOB: 05-Apr-1960  Age: 58 y.o. MRN: [id number]  CC:  Chief Complaint  Patient presents with  . Hypertension    medication management, pt was put on BP medication last visit    HPI Vincent Gentry presents for   Hypertension: New diagnosis at November 25 visit.  Started on amlodipine 5 mg daily.  Also advised to cut back on salt in the diet as well as caffeine with history of palpitations. Tolerating amlodipine - no side effects. Has cut back on sodium Headaches have improved  - on recent HA.and palpitations resolved.  Home readings: 140-145/89 BP Readings from Last 3 Encounters:  01/19/19 (!) 142/84  01/05/19 (!) 183/93  11/14/16 (!) 143/91   Lab Results  Component Value Date   CREATININE 0.85 01/05/2019   Hyperthyroidism Previously followed by endocrinology, status post ablation.  Has not required medications as clinically euthyroid.  Borderline low TSH last visit, free T4 was normal at 1.08.  Plan on recheck levels in approximately 6 weeks. Lab Results  Component Value Date   TSH 0.349 (L) 01/05/2019    History Patient Active Problem List   Diagnosis Date Noted  . Hyperthyroidism 07/26/2013  . History of kidney stones 06/20/2013   Past Medical History:  Diagnosis Date  . Kidney stone   . Thyroid disease    Past Surgical History:  Procedure Laterality Date  . kidney stone removal     No Known Allergies Prior to Admission medications   Medication Sig Start Date End Date Taking? Authorizing Provider  amLODipine (NORVASC) 5 MG tablet Take 1 tablet (5 mg total) by mouth daily. 01/05/19  Yes Shade Flood, MD   Social History   Socioeconomic History  . Marital status: Married    Spouse name: Not on file  . Number of children: Not on file  . Years of education: Not on file  . Highest education level: Not on file  Occupational History  . Not on file  Social Needs  . Financial resource strain: Not on file   . Food insecurity    Worry: Not on file    Inability: Not on file  . Transportation needs    Medical: Not on file    Non-medical: Not on file  Tobacco Use  . Smoking status: Former Games developer  . Smokeless tobacco: Never Used  Substance and Sexual Activity  . Alcohol use: No  . Drug use: No  . Sexual activity: Not on file  Lifestyle  . Physical activity    Days per week: Not on file    Minutes per session: Not on file  . Stress: Not on file  Relationships  . Social Musician on phone: Not on file    Gets together: Not on file    Attends religious service: Not on file    Active member of club or organization: Not on file    Attends meetings of clubs or organizations: Not on file    Relationship status: Not on file  . Intimate partner violence    Fear of current or ex partner: Not on file    Emotionally abused: Not on file    Physically abused: Not on file    Forced sexual activity: Not on file  Other Topics Concern  . Not on file  Social History Narrative   From the [place] -     Review of Systems  Per HPI  Objective:   Vitals:   01/19/19 1342  BP: (!) 142/84  Pulse: 85  Resp: 16  Temp: 98.4 F (36.9 C)  TempSrc: Oral  SpO2: 100%  Weight: 210 lb (95.3 kg)  Height: 6\' 1"  (1.854 m)     Physical Exam Vitals reviewed.  Constitutional:      Appearance: He is well-developed.  HENT:     Head: Normocephalic and atraumatic.  Eyes:     Pupils: Pupils are equal, round, and reactive to light.  Neck:     Thyroid: No thyromegaly or thyroid tenderness.     Vascular: No carotid bruit or JVD.  Cardiovascular:     Rate and Rhythm: Normal rate and regular rhythm.     Heart sounds: Normal heart sounds. No murmur.  Pulmonary:     Effort: Pulmonary effort is normal.     Breath sounds: Normal breath sounds. No rales.  Skin:    General: Skin is warm and dry.  Neurological:     Mental Status: He is alert and oriented to person, place, and time.      Assessment & Plan:  Vincent Gentry is a 58 y.o. male . Essential hypertension - Plan: amLODipine (NORVASC) 10 MG tablet  - decreased control.  Increase amlodpine to 10mg , RTC is precautions if side effects.  Recheck 3 months.  Orthostatic precautions.  Hyperthyroidism - Plan: TSH + free T4  -Lab only visit in the next 1 month, consider follow-up with endocrinologist if persistent elevated TSH.  Meds ordered this encounter  Medications  . amLODipine (NORVASC) 10 MG tablet    Sig: Take 1 tablet (10 mg total) by mouth daily.    Dispense:  90 tablet    Refill:  1   Patient Instructions     Lab only visit in the next 1 month, then follow-up with me for an office visit in 3 months.  If thyroid test remains elevated, I may need to refer you to endocrinology.  Increase amlodipine to 10 mg once per day.  If any lightheadedness, dizziness, ankle swelling or other new side effects, please let me know.  Recheck in 3 months   If you have lab work done today you will be contacted with your lab results within the next 2 weeks.  If you have not heard from [place] then please contact us. The fastest way to get your results is to register for My Chart.   IF you received an x-ray today, you will receive an invoice from [facility]. Please contact [facility] at [phone] with questions or concerns regarding your invoice.   IF you received labwork today, you will receive an invoice from [facility]. Please contact [facility] at [phone] with questions or concerns regarding your invoice.   Our billing staff will not be able to assist you with questions regarding bills from these companies.  You will be contacted with the lab results as soon as they are available. The fastest way to get your results is to activate your My Chart account. Instructions are located on the last page of this paperwork. If you have not heard from [place] regarding the results in 2 weeks, please contact this office.           Signed, Merri Ray, MD [facility]

## 2019-01-20 ENCOUNTER — Encounter: Payer: Self-pay | Admitting: Family Medicine

## 2019-02-16 ENCOUNTER — Other Ambulatory Visit: Payer: Self-pay

## 2019-02-16 ENCOUNTER — Ambulatory Visit (INDEPENDENT_AMBULATORY_CARE_PROVIDER_SITE_OTHER): Payer: Self-pay | Admitting: Family Medicine

## 2019-02-16 DIAGNOSIS — E0590 Thyrotoxicosis, unspecified without thyrotoxic crisis or storm: Secondary | ICD-10-CM

## 2019-02-17 LAB — TSH+FREE T4
Free T4: 1.15 ng/dL (ref 0.82–1.77)
TSH: 0.306 u[IU]/mL — ABNORMAL LOW (ref 0.450–4.500)
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
## 2019-04-22 ENCOUNTER — Other Ambulatory Visit: Payer: Self-pay

## 2019-04-22 ENCOUNTER — Encounter: Payer: Self-pay | Admitting: Family Medicine

## 2019-04-22 ENCOUNTER — Ambulatory Visit (INDEPENDENT_AMBULATORY_CARE_PROVIDER_SITE_OTHER): Payer: Self-pay | Admitting: Family Medicine

## 2019-04-22 VITALS — BP 134/96 | HR 84 | Temp 97.0°F | Ht 73.0 in | Wt 208.0 lb

## 2019-04-22 DIAGNOSIS — I10 Essential (primary) hypertension: Secondary | ICD-10-CM

## 2019-04-22 DIAGNOSIS — R7989 Other specified abnormal findings of blood chemistry: Secondary | ICD-10-CM

## 2019-04-22 DIAGNOSIS — E049 Nontoxic goiter, unspecified: Secondary | ICD-10-CM

## 2019-04-22 MED ORDER — METOPROLOL TARTRATE 25 MG PO TABS: 12.5000 mg | ORAL_TABLET | Freq: Two times a day (BID) | ORAL | 3 refills | Status: DC

## 2019-04-22 MED ORDER — AMLODIPINE BESYLATE 10 MG PO TABS: 10.0000 mg | ORAL_TABLET | Freq: Every day | ORAL | 1 refills | Status: DC

## 2019-04-22 NOTE — Patient Instructions (Addendum)
   Continue amlodipine 10 mg/day, start metoprolol 1/2 pill twice per day for improved blood pressure control.  I did feel some prominence or some possible enlargement of part of your thyroid on the right side.  That could be cause of low thyroid lab.  I will order an ultrasound to make sure there is not a tumor or nodule in that area, then possibly may need to see endocrinology.  Please let me know if there are questions in the meantime.  We will talk further once I see the results of your blood work and the ultrasound.  Return to the clinic or go to the nearest emergency room if any of your symptoms worsen or new symptoms occur.   If you have lab work done today you will be contacted with your lab results within the next 2 weeks.  If you have not heard from [place] then please contact us. The fastest way to get your results is to register for My Chart.   IF you received an x-ray today, you will receive an invoice from [facility]. Please contact [facility] at [phone] with questions or concerns regarding your invoice.   IF you received labwork today, you will receive an invoice from [facility]. Please contact [facility] at [phone] with questions or concerns regarding your invoice.   Our billing staff will not be able to assist you with questions regarding bills from these companies.  You will be contacted with the lab results as soon as they are available. The fastest way to get your results is to activate your My Chart account. Instructions are located on the last page of this paperwork. If you have not heard from [place] regarding the results in 2 weeks, please contact this office.

## 2019-04-22 NOTE — Progress Notes (Signed)
Subjective:  Patient ID: Vincent Gentry, male    DOB: 25-Mar-1960  Age: 59 y.o. MRN: [id number]  CC:  Chief Complaint  Patient presents with  . Follow-up    on hypo thyroidism. pt states he has been doing vary well since last visit. pt hasn't had any isues. pt would like to go over lab results from last visit.    HPI Vincent Gentry presents for   Hyperthyroidism.low tsh.  Lab Results  Component Value Date   TSH 0.161 (L) 04/22/2019  low at 0.349 in 01/05/19. Normal free t4.  Repeat on 1/6- still low as above. Free t4 1.15.  Taking medication daily.  No new hot or cold intolerance. No new hair or skin changes, heart palpitations or new fatigue. No new weight changes.  Plans to travel to [place] next month for 1 month.   Hypertension: Decreased control at 01/19/19 visit - increased norvasc to 10mg . no missed Doing ok on higher dose.  Home readings: variable, 140-150/80.   BP Readings from Last 3 Encounters:  04/22/19 (!) 134/96  01/19/19 (!) 142/84  01/05/19 (!) 183/93   Lab Results  Component Value Date   CREATININE 0.85 01/05/2019     History Patient Active Problem List   Diagnosis Date Noted  . 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 Hyperthyroidism 07/26/2013  . History of kidney stones 06/20/2013   Past Medical History:  Diagnosis Date  . Kidney stone   . Thyroid disease    Past Surgical History:  Procedure Laterality Date  . kidney stone removal     No Known Allergies Prior to Admission medications   Medication Sig Start Date End Date Taking? Authorizing Provider  amLODipine (NORVASC) 10 MG tablet Take 1 tablet (10 mg total) by mouth daily. 01/19/19  Yes 14/9/20, MD   Social History   Socioeconomic History  . Marital status: Married    Spouse name: Not on file  . Number of children: Not on file  . Years of education: Not on file  . Highest education level: Not on file  Occupational History  . Not on file  Tobacco Use  . Smoking status:  Former Shade Flood  . Smokeless tobacco: Never Used  Substance and Sexual Activity  . Alcohol use: No  . Drug use: No  . Sexual activity: Not on file  Other Topics Concern  . Not on file  Social History Narrative   From the [employer] -    Social Determinants of Health   Financial Resource Strain:   . Difficulty of Paying Living Expenses:   Food Insecurity:   . Worried About [place] in the Last Year:   . Programme researcher, broadcasting/film/video in the Last Year:   Transportation Needs:   . Barista (Medical):   Freight forwarder Lack of Transportation (Non-Medical):   Physical Activity:   . Days of Exercise per Week:   . Minutes of Exercise per Session:   Stress:   . Feeling of Stress :   Social Connections:   . Frequency of Communication with Friends and Family:   . Frequency of Social Gatherings with Friends and Family:   . Attends Religious Services:   . Active Member of Clubs or Organizations:   . Attends [employer] Meetings:   Banker Marital Status:   Intimate Partner Violence:   . Fear of Current or Ex-Partner:   . Emotionally Abused:   [employer] Physically Abused:   . Sexually Abused:  Review of Systems  Constitutional: Negative for fatigue and unexpected weight change.  Eyes: Negative for visual disturbance.  Respiratory: Negative for cough, chest tightness and shortness of breath.   Cardiovascular: Negative for chest pain, palpitations and leg swelling.  Gastrointestinal: Negative for abdominal pain and blood in stool.  Neurological: Negative for dizziness, light-headedness and headaches.     Objective:   Vitals:   04/22/19 1421 04/22/19 1426  BP: (!) 143/94 (!) 134/96  Pulse: 84   Temp: (!) 97 F (36.1 C)   TempSrc: Temporal   SpO2: 97%   Weight: 208 lb (94.3 kg)   Height: 6\' 1"  (1.854 m)      Physical Exam Vitals reviewed.  Constitutional:      Appearance: He is well-developed.  HENT:     Head: Normocephalic and atraumatic.  Eyes:     Pupils: Pupils are  equal, round, and reactive to light.  Neck:     Thyroid: Thyroid mass (Prominence/enlargement at the right lower lobe of thyroid.  Nontender.) and thyromegaly present.     Vascular: No carotid bruit or JVD.     Trachea: Trachea and phonation normal.   Cardiovascular:     Rate and Rhythm: Normal rate and regular rhythm.     Heart sounds: Normal heart sounds. No murmur.  Pulmonary:     Effort: Pulmonary effort is normal.     Breath sounds: Normal breath sounds. No rales.  Skin:    General: Skin is warm and dry.  Neurological:     Mental Status: He is alert and oriented to person, place, and time.        Assessment & Plan:  KACE HARTJE is a 59 y.o. male . Essential hypertension - Plan: metoprolol tartrate (LOPRESSOR) 25 MG tablet, amLODipine (NORVASC) 10 MG tablet  -Decreased control.  We will continue amlodipine 10 mg, add low-dose metoprolol 12.5 mg twice daily as that may also help if he does end up being a hyperthyroidism to manage rate.  Thyroid enlargement - Plan: US Soft Tissue Head/Neck (NON-THYROID), TSH + free T4 Elevated TSH - Plan: US Soft Tissue Head/Neck (NON-THYROID), TSH + free T4  -Possible nodule right lower lobe, and with elevated TSH will initially check ultrasound, may need further testing with uptake scan, endocrine eval.  Start with ultrasound, repeat TSH, free T4.  Meds ordered this encounter  Medications  . metoprolol tartrate (LOPRESSOR) 25 MG tablet    Sig: Take 0.5 tablets (12.5 mg total) by mouth 2 (two) times daily.    Dispense:  30 tablet    Refill:  3  . amLODipine (NORVASC) 10 MG tablet    Sig: Take 1 tablet (10 mg total) by mouth daily.    Dispense:  90 tablet    Refill:  1   Patient Instructions     Continue amlodipine 10 mg/day, start metoprolol 1/2 pill twice per day for improved blood pressure control.  I did feel some prominence or some possible enlargement of part of your thyroid on the right side.  That could be cause of low  thyroid lab.  I will order an ultrasound to make sure there is not a tumor or nodule in that area, then possibly may need to see endocrinology.  Please let me know if there are questions in the meantime.  We will talk further once I see the results of your blood work and the ultrasound.  Return to the clinic or go to the nearest emergency room if any  of your symptoms worsen or new symptoms occur.   If you have lab work done today you will be contacted with your lab results within the next 2 weeks.  If you have not heard from [place] then please contact us. The fastest way to get your results is to register for My Chart.   IF you received an x-ray today, you will receive an invoice from [facility]. Please contact [facility] at [phone] with questions or concerns regarding your invoice.   IF you received labwork today, you will receive an invoice from [facility]. Please contact [facility] at [phone] with questions or concerns regarding your invoice.   Our billing staff will not be able to assist you with questions regarding bills from these companies.  You will be contacted with the lab results as soon as they are available. The fastest way to get your results is to activate your My Chart account. Instructions are located on the last page of this paperwork. If you have not heard from [place] regarding the results in 2 weeks, please contact this office.         Signed, Meredith Staggers, MD [facility]

## 2019-04-23 ENCOUNTER — Encounter: Payer: Self-pay | Admitting: Family Medicine

## 2019-04-23 LAB — TSH+FREE T4
Free T4: 1.11 ng/dL (ref 0.82–1.77)
TSH: 0.161 u[IU]/mL — ABNORMAL LOW (ref 0.450–4.500)

## 2019-05-03 ENCOUNTER — Ambulatory Visit
Admission: RE | Admit: 2019-05-03 | Discharge: 2019-05-03 | Disposition: A | Discharge status: Home / Self Care | Payer: Self-pay | Source: Ambulatory Visit | Attending: Family Medicine | Admitting: Family Medicine

## 2019-05-03 DIAGNOSIS — R7989 Other specified abnormal findings of blood chemistry: Secondary | ICD-10-CM

## 2019-05-03 DIAGNOSIS — E049 Nontoxic goiter, unspecified: Secondary | ICD-10-CM

## 2019-05-09 ENCOUNTER — Other Ambulatory Visit: Payer: Self-pay | Admitting: Family Medicine

## 2019-05-09 ENCOUNTER — Other Ambulatory Visit: Payer: Self-pay | Admitting: Registered Nurse

## 2019-05-09 ENCOUNTER — Telehealth: Payer: Self-pay | Admitting: Family Medicine

## 2019-05-09 DIAGNOSIS — R7989 Other specified abnormal findings of blood chemistry: Secondary | ICD-10-CM

## 2019-05-09 DIAGNOSIS — E042 Nontoxic multinodular goiter: Secondary | ICD-10-CM

## 2019-05-09 NOTE — Telephone Encounter (Addendum)
The referral is written as a ENT-Otolaryngology referral, however the patient has requested Dr. Casimiro Needle Altheimer who is an Actor.  We will need a new referral for Endo.

## 2019-05-09 NOTE — Telephone Encounter (Signed)
Yes - I'm happy to cover for him this week.  I have placed the referral to Dr. Leslie Dales for this patient. Please don't hesitate to send over any further requests from Dr. Paralee Cancel pool.  Thank you,  Jari Sportsman, NP

## 2019-10-30 ENCOUNTER — Other Ambulatory Visit: Payer: Self-pay

## 2019-10-30 ENCOUNTER — Encounter (HOSPITAL_COMMUNITY): Payer: Self-pay | Admitting: Emergency Medicine

## 2019-10-30 ENCOUNTER — Emergency Department (HOSPITAL_COMMUNITY)
Admission: EM | Admit: 2019-10-30 | Discharge: 2019-10-31 | Disposition: A | Discharge status: Home / Self Care | Payer: 59 | Attending: Emergency Medicine | Admitting: Emergency Medicine

## 2019-10-30 DIAGNOSIS — U071 COVID-19: Secondary | ICD-10-CM | POA: Insufficient documentation

## 2019-10-30 DIAGNOSIS — Z87891 Personal history of nicotine dependence: Secondary | ICD-10-CM | POA: Insufficient documentation

## 2019-10-30 DIAGNOSIS — R519 Headache, unspecified: Secondary | ICD-10-CM | POA: Diagnosis present

## 2019-10-30 LAB — BASIC METABOLIC PANEL
Anion gap: 12 (ref 5–15)
BUN: 15 mg/dL (ref 6–20)
CO2: 20 mmol/L — ABNORMAL LOW (ref 22–32)
Calcium: 8.1 mg/dL — ABNORMAL LOW (ref 8.9–10.3)
Chloride: 100 mmol/L (ref 98–111)
Creatinine, Ser: 0.88 mg/dL (ref 0.61–1.24)
GFR calc Af Amer: >60 mL/min (ref >60)
GFR calc non Af Amer: >60 mL/min (ref >60)
Glucose, Bld: 99 mg/dL (ref 70–99)
Potassium: 4 mmol/L (ref 3.5–5.1)
Sodium: 132 mmol/L — ABNORMAL LOW (ref 135–145)

## 2019-10-30 LAB — CBC
HCT: 45.3 % (ref 39.0–52.0)
Hemoglobin: 14.9 g/dL (ref 13.0–17.0)
MCH: 27 pg (ref 26.0–34.0)
MCHC: 32.9 g/dL (ref 30.0–36.0)
MCV: 82.2 fL (ref 80.0–100.0)
Platelets: 266 10*3/uL (ref 150–400)
RBC: 5.51 MIL/uL (ref 4.22–5.81)
RDW: 13.2 % (ref 11.5–15.5)
WBC: 2.9 10*3/uL — ABNORMAL LOW (ref 4.0–10.5)
nRBC: 0 % (ref 0.0–0.2)

## 2019-10-30 LAB — SARS CORONAVIRUS 2 BY RT PCR (HOSPITAL ORDER, PERFORMED IN ~~LOC~~ HOSPITAL LAB): SARS Coronavirus 2: POSITIVE — AB

## 2019-10-30 MED ORDER — ACETAMINOPHEN 325 MG PO TABS
650.0000 mg | ORAL_TABLET | Freq: Once | ORAL | Status: AC | PRN
  Administered 2019-10-30: 650 mg via ORAL
  Filled 2019-10-30: qty 2

## 2019-10-30 NOTE — ED Triage Notes (Signed)
Pt reports generalized weakness, fatigue, body aches, cough, and diarrhea since Tuesday.  States he had abd pain Tuesday and Wednesday but it has since resolved.  Tested negative for COVID this week.

## 2019-10-30 NOTE — ED Notes (Signed)
Pt moved to + confinement box

## 2019-10-31 ENCOUNTER — Telehealth: Payer: Self-pay | Admitting: Physician Assistant

## 2019-10-31 MED ORDER — ONDANSETRON 4 MG PO TBDP
4.0000 mg | ORAL_TABLET | Freq: Once | ORAL | Status: AC
  Administered 2019-10-31: 4 mg via ORAL
  Filled 2019-10-31: qty 1

## 2019-10-31 MED ORDER — ONDANSETRON 4 MG PO TBDP: 4.0000 mg | ORAL_TABLET | Freq: Three times a day (TID) | ORAL | 0 refills | Status: DC | PRN

## 2019-10-31 MED ORDER — BENZONATATE 100 MG PO CAPS: 100.0000 mg | ORAL_CAPSULE | Freq: Three times a day (TID) | ORAL | 0 refills | Status: DC

## 2019-10-31 MED ORDER — HYDROCOD POLST-CPM POLST ER 10-8 MG/5ML PO SUER
5.0000 mL | Freq: Once | ORAL | Status: AC
  Administered 2019-10-31: 5 mL via ORAL
  Filled 2019-10-31: qty 5

## 2019-10-31 NOTE — Discharge Instructions (Signed)
Take tylenol 2 pills 4 times a day and motrin 4 pills 3 times a day.  Drink plenty of fluids.  Return for worsening shortness of breath, headache, confusion. Follow up with your family doctor.   

## 2019-10-31 NOTE — ED Provider Notes (Signed)
[facility] EMERGENCY DEPARTMENT Provider Note   CSN: [id number] Arrival date & time: 10/30/19  1642     History Chief Complaint  Patient presents with  . COVID testing  . Weakness  . Cough    Vincent Gentry is a 59 y.o. male.  59 yo M with a chief complaints of headache cough congestion nausea diarrhea decreased oral intake fevers chills myalgias loss of taste and smell.  Going on for about 6 days now.  He thinks he got sick from someone at work.  His wife is also sick with a similar illness.  Has not had a thing really to eat and drink for past 3 days.  Feels little bit lightheaded when he stands up.  Denies abdominal pain denies chest pain.  Denies difficulty breathing.  The history is provided by the patient.  Weakness Associated symptoms: cough and diarrhea   Associated symptoms: no abdominal pain, no arthralgias, no chest pain, no fever, no headaches, no myalgias, no shortness of breath and no vomiting   Cough Associated symptoms: no chest pain, no chills, no eye discharge, no fever, no headaches, no myalgias, no rash and no shortness of breath   Illness Severity:  Mild Onset quality:  Gradual Duration:  6 days Timing:  Constant Progression:  Worsening Chronicity:  New Associated symptoms: cough and diarrhea   Associated symptoms: no abdominal pain, no chest pain, no congestion, no fever, no headaches, no myalgias, no rash, no shortness of breath and no vomiting        Past Medical History:  Diagnosis Date  . Kidney stone   . Thyroid disease     Patient Active Problem List   Diagnosis Date Noted  . 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 Hyperthyroidism 07/26/2013  . History of kidney stones 06/20/2013    Past Surgical History:  Procedure Laterality Date  . kidney stone removal         Family History  Problem Relation Age of Onset  . Diabetes Father   . Thyroid disease Paternal Uncle     Social History   Tobacco Use  .  Smoking status: Former Games developer  . Smokeless tobacco: Never Used  Substance Use Topics  . Alcohol use: No  . Drug use: No    Home Medications Prior to Admission medications   Medication Sig Start Date End Date Taking? Authorizing Provider  amLODipine (NORVASC) 10 MG tablet Take 1 tablet (10 mg total) by mouth daily. 04/22/19   Shade Flood, MD  benzonatate (TESSALON) 100 MG capsule Take 1 capsule (100 mg total) by mouth every 8 (eight) hours. 10/31/19   Melene Plan, DO  metoprolol tartrate (LOPRESSOR) 25 MG tablet Take 0.5 tablets (12.5 mg total) by mouth 2 (two) times daily. 04/22/19   Shade Flood, MD  ondansetron (ZOFRAN ODT) 4 MG disintegrating tablet Take 1 tablet (4 mg total) by mouth every 8 (eight) hours as needed for nausea or vomiting. 10/31/19   Melene Plan, DO    Allergies    Patient has no known allergies.  Review of Systems   Review of Systems  Constitutional: Negative for chills and fever.  HENT: Negative for congestion and facial swelling.   Eyes: Negative for discharge and visual disturbance.  Respiratory: Positive for cough. Negative for shortness of breath.   Cardiovascular: Negative for chest pain and palpitations.  Gastrointestinal: Positive for diarrhea. Negative for abdominal pain and vomiting.  Musculoskeletal: Negative for arthralgias and myalgias.  Skin: Negative  for color change and rash.  Neurological: Positive for weakness. Negative for tremors, syncope and headaches.  Psychiatric/Behavioral: Negative for confusion and dysphoric mood.    Physical Exam Updated Vital Signs BP 131/76   Pulse 81   Temp 98.8 F (37.1 C) (Oral)   Resp 20   SpO2 99%   Physical Exam Vitals and nursing note reviewed.  Constitutional:      Appearance: He is well-developed.  HENT:     Head: Normocephalic and atraumatic.  Eyes:     Pupils: Pupils are equal, round, and reactive to light.  Neck:     Vascular: No JVD.  Cardiovascular:     Rate and Rhythm: Normal  rate and regular rhythm.     Heart sounds: No murmur heard.  No friction rub. No gallop.   Pulmonary:     Effort: No respiratory distress.     Breath sounds: No wheezing.  Abdominal:     General: There is no distension.     Tenderness: There is no abdominal tenderness. There is no guarding or rebound.  Musculoskeletal:        General: Normal range of motion.     Cervical back: Normal range of motion and neck supple.  Skin:    Coloration: Skin is not pale.     Findings: No rash.  Neurological:     Mental Status: He is alert and oriented to person, place, and time.  Psychiatric:        Behavior: Behavior normal.     ED Results / Procedures / Treatments   Labs (all labs ordered are listed, but only abnormal results are displayed) Labs Reviewed  SARS CORONAVIRUS 2 BY RT PCR (HOSPITAL ORDER, PERFORMED IN [facility]) - Abnormal; Notable for the following components:      Result Value   SARS Coronavirus 2 POSITIVE (*)    All other components within normal limits  BASIC METABOLIC PANEL - Abnormal; Notable for the following components:   Sodium 132 (*)    CO2 20 (*)    Calcium 8.1 (*)    All other components within normal limits  CBC - Abnormal; Notable for the following components:   WBC 2.9 (*)    All other components within normal limits  URINALYSIS, ROUTINE W REFLEX MICROSCOPIC    EKG EKG Interpretation  Date/Time:  Sunday October 30 2019 17:30:30 EDT Ventricular Rate:  87 PR Interval:  140 QRS Duration: 126 QT Interval:  368 QTC Calculation: 442 R Axis:   22 Text Interpretation: Normal sinus rhythm Right bundle branch block Abnormal ECG No significant change since last tracing Confirmed by Dashiel Bergquist ([id number]) on 10/31/2019 2:14:23 AM   Radiology No results found.  Procedures Procedures (including critical care time)  Medications Ordered in ED Medications  acetaminophen (TYLENOL) tablet 650 mg (650 mg Oral Given 10/30/19 1740)    chlorpheniramine-HYDROcodone (TUSSIONEX) 10-8 MG/5ML suspension 5 mL (5 mLs Oral Given 10/31/19 0255)  ondansetron (ZOFRAN-ODT) disintegrating tablet 4 mg (4 mg Oral Given 10/31/19 0255)    ED Course  I have reviewed the triage vital signs and the nursing notes.  Pertinent labs & imaging results that were available during my care of the patient were reviewed by me and considered in my medical decision making (see chart for details).    MDM Rules/Calculators/A&P                          59  yo M with  a chief complaints of cough congestion fevers chills myalgias nausea diarrhea loss of taste and smell.  Going on for the past 6 days.  Test positive here for the coronavirus.  He is well-appearing nontoxic.  No increased work of breathing.  100% on room air on my exam in the room.  Lab work performed for decreased oral intake.  Has a mild leukopenia mild hyponatremia.   Ambulates without hypoxia.  D/c home.  PCP follow up.  3:37 AM:  I have discussed the diagnosis/risks/treatment options with the patient and believe the pt to be eligible for discharge home to follow-up with PCP. We also discussed returning to the ED immediately if new or worsening sx occur. We discussed the sx which are most concerning (e.g., sudden worsening pain, fever, inability to tolerate by mouth) that necessitate immediate return. Medications administered to the patient during their visit and any new prescriptions provided to the patient are listed below.  Medications given during this visit Medications  acetaminophen (TYLENOL) tablet 650 mg (650 mg Oral Given 10/30/19 1740)  chlorpheniramine-HYDROcodone (TUSSIONEX) 10-8 MG/5ML suspension 5 mL (5 mLs Oral Given 10/31/19 0255)  ondansetron (ZOFRAN-ODT) disintegrating tablet 4 mg (4 mg Oral Given 10/31/19 0255)     The patient appears reasonably screen and/or stabilized for discharge and I doubt any other medical condition or other [facility] requiring further screening,  evaluation, or treatment in the ED at this time prior to discharge.   Final Clinical Impression(s) / ED Diagnoses Final diagnoses:  COVID-19 virus infection    Rx / DC Orders ED Discharge Orders         Ordered    benzonatate (TESSALON) 100 MG capsule  Every 8 hours        10/31/19 0336    ondansetron (ZOFRAN ODT) 4 MG disintegrating tablet  Every 8 hours PRN        10/31/19 0336           Melene Plan, DO 10/31/19 [phone]

## 2019-10-31 NOTE — Telephone Encounter (Signed)
  Called to discuss with patient about Covid symptoms and the use of casirivimab/imdevimab, a monoclonal antibody infusion for those with mild to moderate Covid symptoms and at a high risk of hospitalization.   Message left to call back our hotline [phone] and sent mychart.  Manson Passey, PA - C

## 2019-11-01 ENCOUNTER — Telehealth: Payer: Self-pay | Admitting: Family Medicine

## 2019-11-01 NOTE — Telephone Encounter (Signed)
Called to schedule appointment for patient per referral for Transition of Care Clinic / Post Covid Care Clinic. Unable to reach patient. Left voicemail for patient to return call.  

## 2019-11-09 ENCOUNTER — Other Ambulatory Visit: Payer: Self-pay

## 2019-11-09 ENCOUNTER — Ambulatory Visit (INDEPENDENT_AMBULATORY_CARE_PROVIDER_SITE_OTHER): Payer: 59 | Admitting: Nurse Practitioner

## 2019-11-09 VITALS — HR 115 | Temp 98.9°F

## 2019-11-09 DIAGNOSIS — R05 Cough: Secondary | ICD-10-CM

## 2019-11-09 DIAGNOSIS — R059 Cough, unspecified: Secondary | ICD-10-CM

## 2019-11-09 DIAGNOSIS — Z8616 Personal history of COVID-19: Secondary | ICD-10-CM

## 2019-11-09 MED ORDER — AZITHROMYCIN 250 MG PO TABS: ORAL_TABLET | ORAL | 0 refills | Status: DC

## 2019-11-09 MED ORDER — BENZONATATE 100 MG PO CAPS: 100.0000 mg | ORAL_CAPSULE | Freq: Two times a day (BID) | ORAL | 0 refills | Status: DC | PRN

## 2019-11-09 MED ORDER — ONDANSETRON 4 MG PO TBDP: 4.0000 mg | ORAL_TABLET | Freq: Three times a day (TID) | ORAL | 0 refills | Status: DC | PRN

## 2019-11-09 NOTE — Progress Notes (Signed)
@Patient  ID: , male    DOB: [birth year], 59 y.o.   MRN: [id number]  Chief Complaint  Patient presents with  . Hospitalization Follow-up    cough, loss of taste and smell    Referring provider: [id number], MD   59 year old male with history of thyroid disease and kidney stones.  Diagnosed with COVID on 10/30/2019.  HPI  Patient presents today for hospital follow-up.  He was seen in the ED on 10/30/2019.  He was given Tussionex and discharged home.  He states that since he was discharged from the ED he has been improving slightly.  He states that he has developed a cough.  He denies any significant shortness of breath.  He denies any recent fever. Denies f/c/s, n/v/d, hemoptysis, PND, chest pain or edema.       No Known Allergies  Immunization History  Administered Date(s) Administered  . Meningococcal Polysaccharide 08/04/2011    Past Medical History:  Diagnosis Date  . Kidney stone   . Thyroid disease     Tobacco History: Social History   Tobacco Use  Smoking Status Former Smoker  Smokeless Tobacco Never Used   Counseling given: Yes   Outpatient Encounter Medications as of 11/09/2019  Medication Sig  . amLODipine (NORVASC) 10 MG tablet Take 1 tablet (10 mg total) by mouth daily.  11/11/2019 azithromycin (ZITHROMAX) 250 MG tablet Take 2 tablets (500 mg) on day 1, then take 1 tablet (250 mg) on days 2-5  . benzonatate (TESSALON) 100 MG capsule Take 1 capsule (100 mg total) by mouth every 8 (eight) hours.  . benzonatate (TESSALON) 100 MG capsule Take 1 capsule (100 mg total) by mouth 2 (two) times daily as needed for cough.  . metoprolol tartrate (LOPRESSOR) 25 MG tablet Take 0.5 tablets (12.5 mg total) by mouth 2 (two) times daily.  . ondansetron (ZOFRAN ODT) 4 MG disintegrating tablet Take 1 tablet (4 mg total) by mouth every 8 (eight) hours as needed for nausea or vomiting.  . ondansetron (ZOFRAN ODT) 4 MG disintegrating tablet Take 1 tablet (4 mg total)  by mouth every 8 (eight) hours as needed for nausea or vomiting.   No facility-administered encounter medications on file as of 11/09/2019.     Review of Systems  Review of Systems  Constitutional: Negative.  Negative for fatigue and fever.  HENT: Negative.   Respiratory: Positive for cough and shortness of breath.   Cardiovascular: Negative.  Negative for chest pain, palpitations and leg swelling.  Gastrointestinal: Negative.   Allergic/Immunologic: Negative.   Neurological: Negative.   Psychiatric/Behavioral: Negative.        Physical Exam  Pulse (!) 115   Temp 98.9 F (37.2 C)   SpO2 97% Comment: RA  Wt Readings from Last 5 Encounters:  04/22/19 208 lb (94.3 kg)  01/19/19 210 lb (95.3 kg)  01/05/19 209 lb 12.8 oz (95.2 kg)  11/14/16 201 lb (91.2 kg)  06/19/16 203 lb 9.6 oz (92.4 kg)     Physical Exam Vitals and nursing note reviewed.  Constitutional:      General: He is not in acute distress.    Appearance: He is well-developed.  Cardiovascular:     Rate and Rhythm: Normal rate and regular rhythm.  Pulmonary:     Effort: Pulmonary effort is normal.     Breath sounds: Normal breath sounds.  Musculoskeletal:     Right lower leg: No edema.     Left lower leg: No edema.  Skin:  General: Skin is warm and dry.  Neurological:     Mental Status: He is alert and oriented to person, place, and time.  Psychiatric:        Mood and Affect: Mood normal.        Behavior: Behavior normal.        Assessment & Plan:   History of COVID-19 Cough:   Stay well hydrated  Stay active  Deep breathing exercises  May start vitamin C 2,000 mg daily, vitamin D3 2,000 IU daily, Zinc 220 mg daily  May take tylenol or fever or pain  May take mucinex DM twice daily  Will order azithromycin - cough  Will order tessalon perles  - cough      Follow up:  Follow up in 3 weeks or sooner if needed       Ivonne Andrew, NP 11/09/2019

## 2019-11-09 NOTE — Assessment & Plan Note (Signed)
Cough:   Stay well hydrated  Stay active  Deep breathing exercises  May start vitamin C 2,000 mg daily, vitamin D3 2,000 IU daily, Zinc 220 mg daily  May take tylenol or fever or pain  May take mucinex DM twice daily  Will order azithromycin - cough  Will order tessalon perles  - cough      Follow up:  Follow up in 3 weeks or sooner if needed

## 2019-11-09 NOTE — Patient Instructions (Addendum)
Covid 19 Cough:   Stay well hydrated  Stay active  Deep breathing exercises  May start vitamin C 2,000 mg daily, vitamin D3 2,000 IU daily, Zinc 220 mg daily  May take tylenol or fever or pain  May take mucinex DM twice daily  Will order azithromycin - cough  Will order tessalon perles  - cough      Follow up:  Follow up in 3 weeks or sooner if needed

## 2019-11-30 ENCOUNTER — Ambulatory Visit (INDEPENDENT_AMBULATORY_CARE_PROVIDER_SITE_OTHER): Payer: 59 | Admitting: Nurse Practitioner

## 2019-11-30 VITALS — BP 122/88 | HR 93 | Temp 97.8°F | Ht 73.0 in | Wt 187.0 lb

## 2019-11-30 DIAGNOSIS — R002 Palpitations: Secondary | ICD-10-CM

## 2019-11-30 DIAGNOSIS — Z8616 Personal history of COVID-19: Secondary | ICD-10-CM | POA: Diagnosis not present

## 2019-11-30 NOTE — Progress Notes (Signed)
@Patient  ID: , male    DOB: 1961/01/03, 59 y.o.   MRN: [id number]  Chief Complaint  Patient presents with  . Follow-up    Feeling better, feels heart racing    Referring provider: [id number], MD   59 year old male with history of thyroid disease and kidney stones.  Diagnosed with COVID on 10/30/2019.  HPI  Patient presents today for post COVID care clinic visit. Patient was last seen in this office on 11/09/2019. Patient was treated with azithromycin Tessalon Perles and Mucinex. Patient states that he is much improved at this point. He does state that he has heart racing at times. Patient does have thyroid disease and does follow with endocrinology. He does have a upcoming appointment soon with endocrinology but is requesting to have thyroid panel checked today during this visit. We will check thyroid panel and TSH per patient request. EKG in office today shows sinus rhythm with occasional PVCs and right bundle branch block. Denies f/c/s, n/v/d, hemoptysis, PND, chest pain or edema.    No Known Allergies  Immunization History  Administered Date(s) Administered  . Meningococcal Polysaccharide 08/04/2011    Past Medical History:  Diagnosis Date  . Kidney stone   . Thyroid disease     Tobacco History: Social History   Tobacco Use  Smoking Status Former Smoker  Smokeless Tobacco Never Used   Counseling given: Yes   Outpatient Encounter Medications as of 11/30/2019  Medication Sig  . amLODipine (NORVASC) 10 MG tablet Take 1 tablet (10 mg total) by mouth daily.  . metoprolol tartrate (LOPRESSOR) 25 MG tablet Take 0.5 tablets (12.5 mg total) by mouth 2 (two) times daily.  . benzonatate (TESSALON) 100 MG capsule Take 1 capsule (100 mg total) by mouth every 8 (eight) hours. (Patient not taking: Reported on 11/30/2019)  . benzonatate (TESSALON) 100 MG capsule Take 1 capsule (100 mg total) by mouth 2 (two) times daily as needed for cough. (Patient not taking:  Reported on 11/30/2019)  . ondansetron (ZOFRAN ODT) 4 MG disintegrating tablet Take 1 tablet (4 mg total) by mouth every 8 (eight) hours as needed for nausea or vomiting. (Patient not taking: Reported on 11/30/2019)  . ondansetron (ZOFRAN ODT) 4 MG disintegrating tablet Take 1 tablet (4 mg total) by mouth every 8 (eight) hours as needed for nausea or vomiting. (Patient not taking: Reported on 11/30/2019)  . [DISCONTINUED] azithromycin (ZITHROMAX) 250 MG tablet Take 2 tablets (500 mg) on day 1, then take 1 tablet (250 mg) on days 2-5   No facility-administered encounter medications on file as of 11/30/2019.     Review of Systems  Review of Systems  Constitutional: Negative.  Negative for fatigue and fever.  HENT: Negative.   Respiratory: Negative for cough and shortness of breath.   Cardiovascular: Positive for palpitations. Negative for chest pain and leg swelling.  Gastrointestinal: Negative.   Allergic/Immunologic: Negative.   Neurological: Negative.   Psychiatric/Behavioral: Negative.        Physical Exam  BP 122/88 (BP Location: Left Arm)   Pulse 93   Temp 97.8 F (36.6 C)   Ht 6\' 1"  (1.854 m)   Wt 187 lb 0.1 oz (84.8 kg)   SpO2 96%   BMI 24.67 kg/m   Wt Readings from Last 5 Encounters:  11/30/19 187 lb 0.1 oz (84.8 kg)  04/22/19 208 lb (94.3 kg)  01/19/19 210 lb (95.3 kg)  01/05/19 209 lb 12.8 oz (95.2 kg)  11/14/16 201 lb (91.2 kg)  Physical Exam Vitals and nursing note reviewed.  Constitutional:      General: He is not in acute distress.    Appearance: He is well-developed.  Cardiovascular:     Rate and Rhythm: Normal rate and regular rhythm.  Pulmonary:     Effort: Pulmonary effort is normal.     Breath sounds: Normal breath sounds.  Musculoskeletal:     Right lower leg: No edema.     Left lower leg: No edema.  Skin:    General: Skin is warm and dry.  Neurological:     Mental Status: He is alert and oriented to person, place, and time.   Psychiatric:        Mood and Affect: Mood normal.        Behavior: Behavior normal.        Assessment & Plan:   History of COVID-19 Stay well hydrated  Stay active  Deep breathing exercises  May take tylenol or fever or pain  May take mucinex DM twice daily if needed   Heart palpitations:  Will check labs - will check thyroid panel per pt request - pt has an upcoming appointment scheduled with endocrinology  EKG showed: sinus rhythm with PVC, RBBB   Follow up:  Follow up as needed after seen by endocrinology      Ivonne Andrew, NP 12/01/2019

## 2019-11-30 NOTE — Patient Instructions (Addendum)
Covid 19:   Stay well hydrated  Stay active  Deep breathing exercises  May take tylenol or fever or pain  May take mucinex DM twice daily if needed   Heart palpitations:  Will check labs - will check thyroid panel per pt request - pt has an upcoming appointment scheduled with endocrinology  EKG showed: sinus rhythm with PVC, RBBB   Follow up:  Follow up as needed after seen by endocrinology

## 2019-12-01 LAB — COMPREHENSIVE METABOLIC PANEL
ALT: 24 IU/L (ref 0–44)
AST: 18 IU/L (ref 0–40)
Albumin/Globulin Ratio: 1.2 (ref 1.2–2.2)
Albumin: 3.9 g/dL (ref 3.8–4.9)
Alkaline Phosphatase: 108 IU/L (ref 44–121)
BUN/Creatinine Ratio: 8 — ABNORMAL LOW (ref 9–20)
BUN: 6 mg/dL (ref 6–24)
Bilirubin Total: 0.3 mg/dL (ref 0.0–1.2)
CO2: 23 mmol/L (ref 20–29)
Calcium: 9.3 mg/dL (ref 8.7–10.2)
Chloride: 104 mmol/L (ref 96–106)
Creatinine, Ser: 0.79 mg/dL (ref 0.76–1.27)
GFR calc Af Amer: 114 mL/min/{1.73_m2} (ref >59)
GFR calc non Af Amer: 98 mL/min/{1.73_m2} (ref >59)
Globulin, Total: 3.3 g/dL (ref 1.5–4.5)
Glucose: 89 mg/dL (ref 65–99)
Potassium: 4.4 mmol/L (ref 3.5–5.2)
Sodium: 140 mmol/L (ref 134–144)
Total Protein: 7.2 g/dL (ref 6.0–8.5)

## 2019-12-01 LAB — THYROID PANEL WITH TSH
Free Thyroxine Index: 3.2 (ref 1.2–4.9)
T3 Uptake Ratio: 30 % (ref 24–39)
T4, Total: 10.5 ug/dL (ref 4.5–12.0)
TSH: 0.005 u[IU]/mL — ABNORMAL LOW (ref 0.450–4.500)

## 2019-12-01 LAB — CBC
Hematocrit: 43.4 % (ref 37.5–51.0)
Hemoglobin: 14.6 g/dL (ref 13.0–17.7)
MCH: 26.5 pg — ABNORMAL LOW (ref 26.6–33.0)
MCHC: 33.6 g/dL (ref 31.5–35.7)
MCV: 79 fL (ref 79–97)
Platelets: 385 10*3/uL (ref 150–450)
RBC: 5.51 x10E6/uL (ref 4.14–5.80)
RDW: 13.9 % (ref 11.6–15.4)
WBC: 4.1 10*3/uL (ref 3.4–10.8)

## 2019-12-01 NOTE — Assessment & Plan Note (Signed)
Stay well hydrated  Stay active  Deep breathing exercises  May take tylenol or fever or pain  May take mucinex DM twice daily if needed   Heart palpitations:  Will check labs - will check thyroid panel per pt request - pt has an upcoming appointment scheduled with endocrinology  EKG showed: sinus rhythm with PVC, RBBB   Follow up:  Follow up as needed after seen by endocrinology

## 2021-08-10 IMAGING — US US THYROID
1 series · 12 of 25 positions shown · non-contrast
Comparison: None.

CLINICAL DATA: Thyroid enlargement.  Right thyroid prominence.

EXAM:
THYROID ULTRASOUND
TECHNIQUE: Ultrasound examination of the thyroid gland and adjacent soft
tissues was performed.

[Series 1: us thyroid · 0.07mm/px · 12 of 73 slices shown]
[im 4/73]
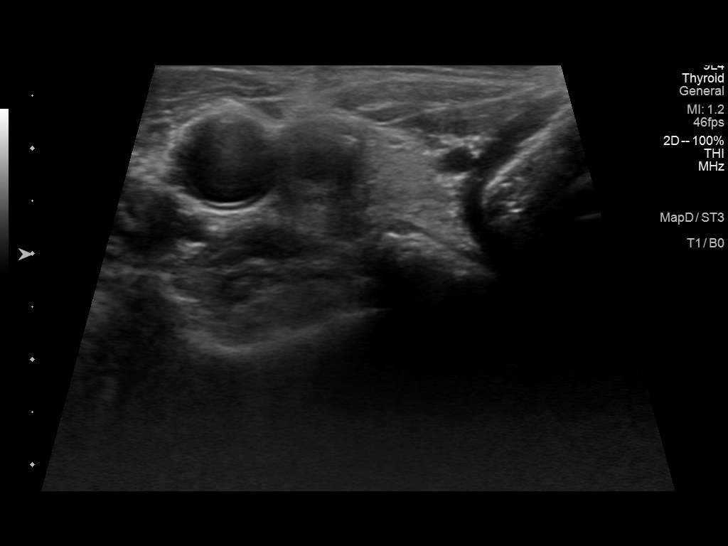
[im 10/73]
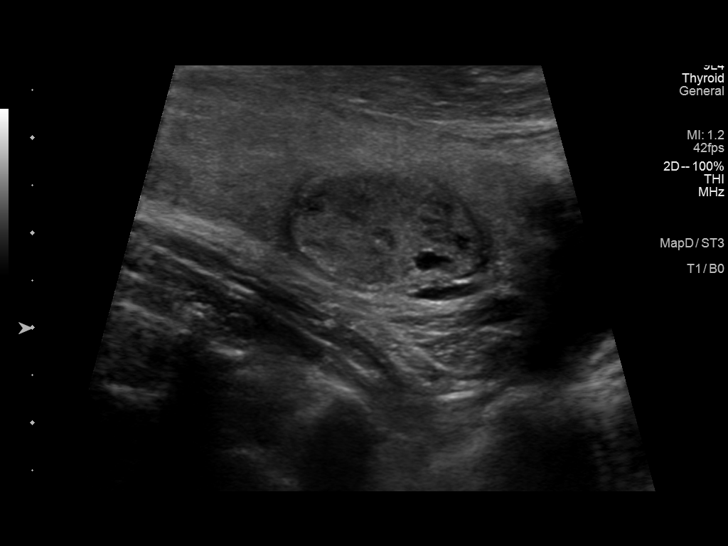
[im 16/73]
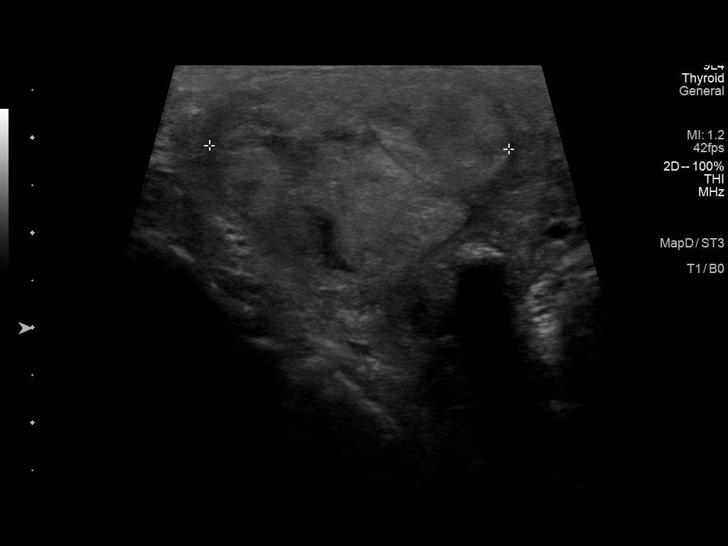
[im 22/73]
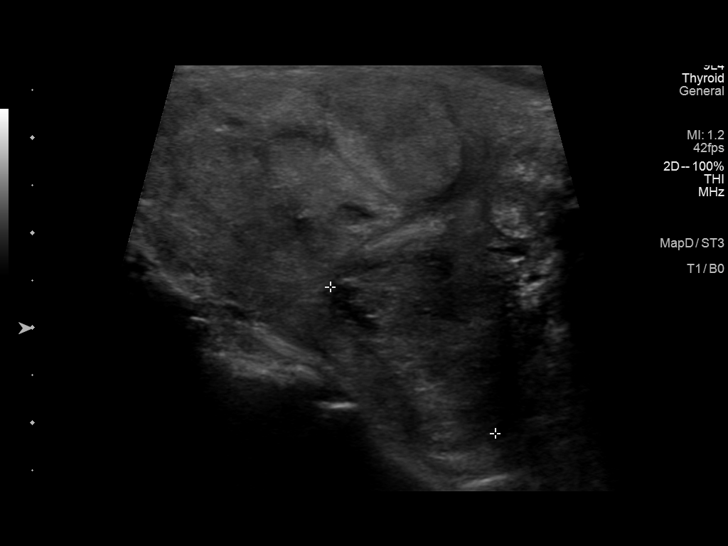
[im 28/73]
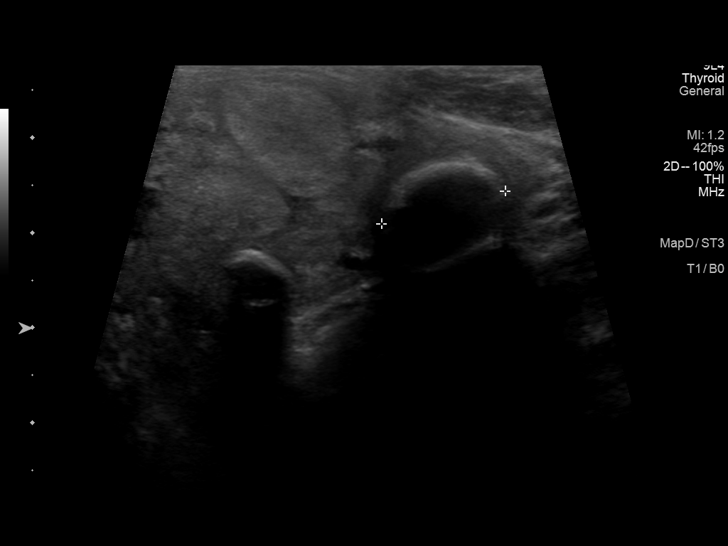
[im 34/73]
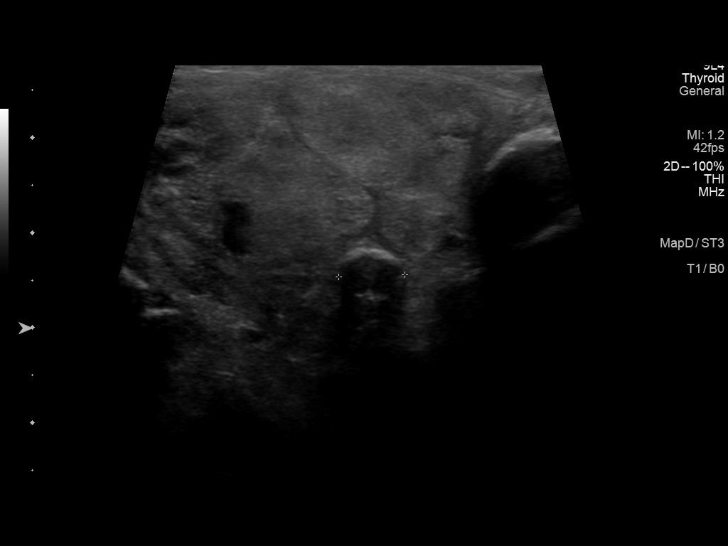
[im 40/73]
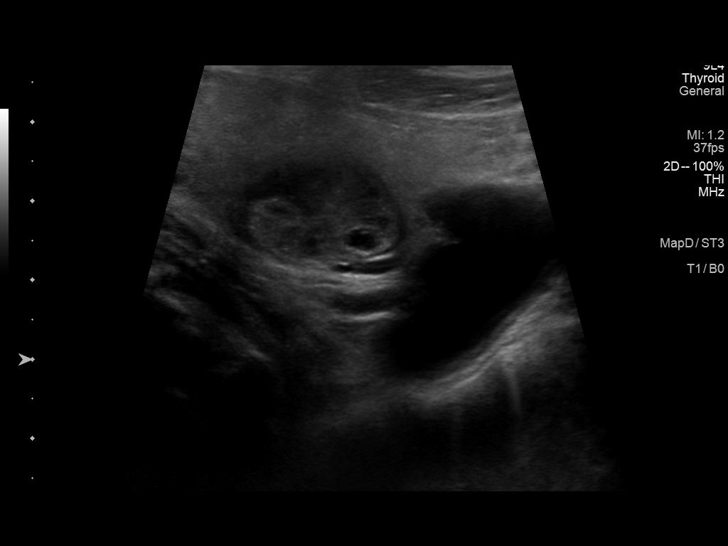
[im 46/73]
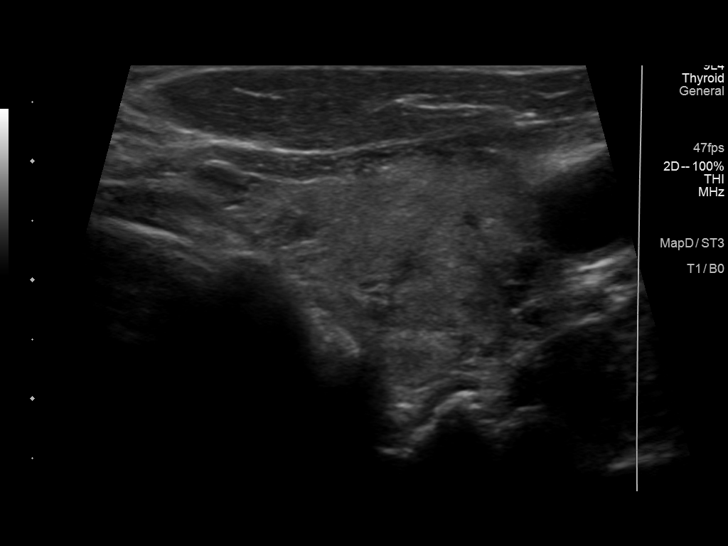
[im 52/73]
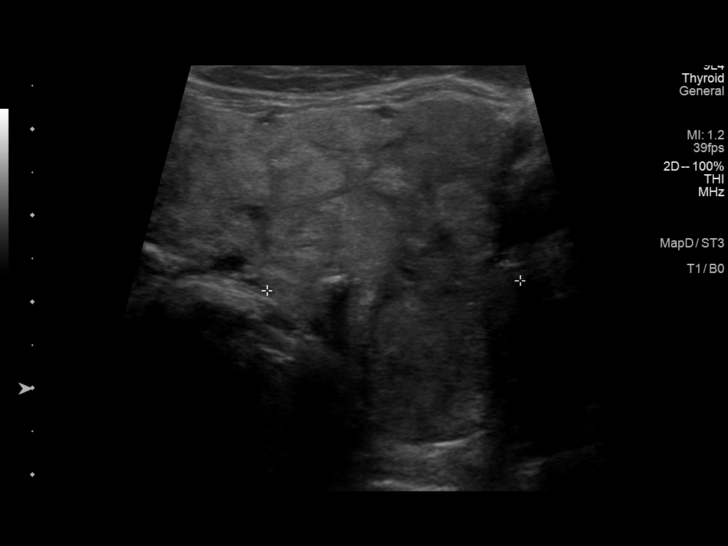
[im 58/73]
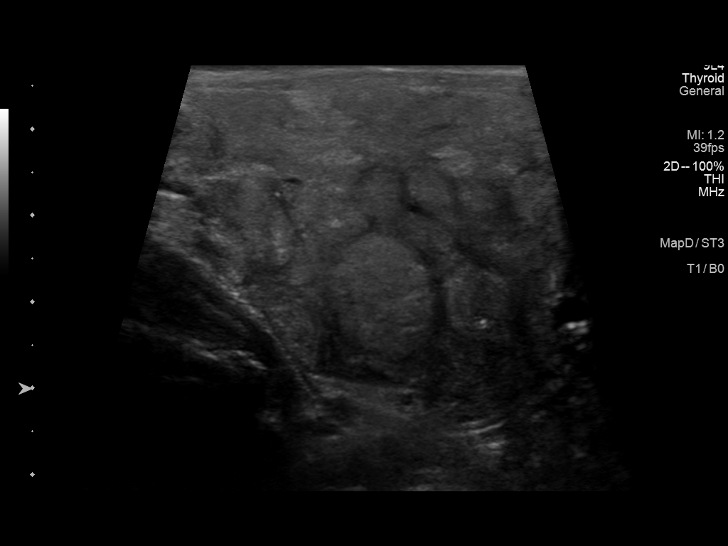
[im 64/73]
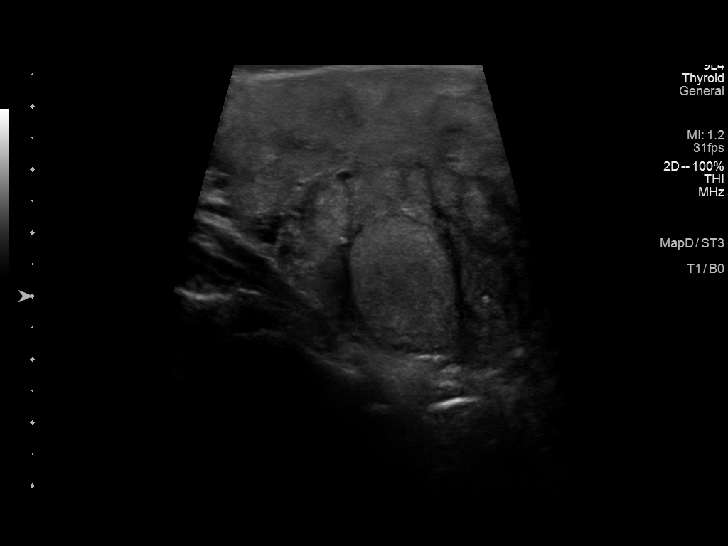
[im 70/73]
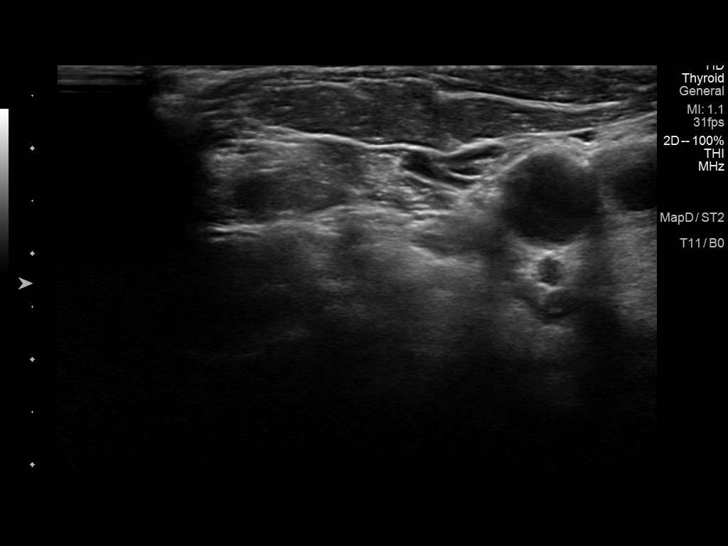

[12 of 25 positions shown; findings below may reference images not displayed]

FINDINGS: Parenchymal Echotexture: Markedly heterogenous

Isthmus: 0.6 cm

Right lobe: 8.2 x 3.8 x 3.7 cm

Left lobe: 8.4 x 4.9 x 2.9 cm

_________________________________________________________

Estimated total number of nodules >/= 1 cm: 4

Number of spongiform nodules >/=  2 cm not described below (TR1): 0

Number of mixed cystic and solid nodules >/= 1.5 cm not described
below (TR2): 0

_________________________________________________________

Nodule # 1:

Location: Right; Mid

Maximum size: 2.1 cm; Other 2 dimensions: 1.2 x 1.2 cm

Composition: solid/almost completely solid (2)

Echogenicity: hypoechoic (2)

Shape: not taller-than-wide (0)

Margins: smooth (0)

Echogenic foci: none (0)

ACR TI-RADS total points: 4.

ACR TI-RADS risk category: TR4 (4-6 points).

ACR TI-RADS recommendations:

**Given size (>/= 1.5 cm) and appearance, fine needle aspiration of
this moderately suspicious nodule should be considered based on
TI-RADS criteria.

Nodule # 2:

Location: Right; Mid

Maximum size: 3.2 cm; Other 2 dimensions: 2.1 x 2.3 cm

Composition: solid/almost completely solid (2)

Echogenicity: isoechoic (1)

Shape: not taller-than-wide (0)

Margins: smooth (0)

Echogenic foci: none (0)

ACR TI-RADS total points: 3.

ACR TI-RADS risk category: TR3 (3 points).

ACR TI-RADS recommendations:

**Given size (>/= 2.5 cm) and appearance, fine needle aspiration of
this mildly suspicious nodule should be considered based on TI-RADS
criteria.

_________________________________________________________

Nodule # 3:

Location: Right; Inferior

Maximum size: 2.4 cm; Other 2 dimensions: 2.3 x 1.8 cm

Composition: solid/almost completely solid (2)

Echogenicity: hypoechoic (2)

Shape: taller-than-wide (3)

Margins: ill-defined (0)

Echogenic foci: none (0)

ACR TI-RADS total points: 7.

ACR TI-RADS risk category: TR5 (>/= 7 points).

ACR TI-RADS recommendations:

**Given size (>/= 1.0 cm) and appearance, fine needle aspiration of
this highly suspicious nodule should be considered based on TI-RADS
criteria.

Nodule # 4:

Location: Right; Inferior

Maximum size: 1.4 cm; Other 2 dimensions: 1.1 x 1.1 cm

Composition: cannot determine (2)

Echogenicity: cannot determine (1)

Shape: not taller-than-wide (0)

Margins: smooth (0)

Echogenic foci: peripheral calcifications (2)

ACR TI-RADS total points: 4.

ACR TI-RADS risk category: TR4 (4-6 points).

ACR TI-RADS recommendations:

*Given size (>/= 1 - 1.4 cm) and appearance, a follow-up ultrasound
in 1 year should be considered based on TI-RADS criteria.

_________________________________________________________

Additional 0.7 cm calcified nodule in the right thyroid lobe.

Periphery calcified nodule in the mid left thyroid lobe measures up
to 0.6 cm.

Left thyroid lobe is diffusely lobulated with multiple pseudo
nodules.
IMPRESSION: 1. Multinodular goiter.
2. Several right thyroid nodules. Three of the right thyroid nodules
meet criteria for ultrasound-guided biopsy. TI-RADS recommends
biopsy of the two most suspicious nodules which are nodule #1 in the
mid right thyroid lobe and nodule #3 in the inferior right thyroid
lobe.
3. Recommend 1 year follow-up of nodules #2 and #4.

The above is in keeping with the ACR TI-RADS recommendations - [HOSPITAL] 6778;[DATE].

## 2023-04-29 ENCOUNTER — Inpatient Hospital Stay (HOSPITAL_COMMUNITY)
Admission: EM | Admit: 2023-04-29 | Discharge: 2023-05-01 | DRG: 603 | Disposition: A | Discharge status: Home / Self Care | Payer: Self-pay | Attending: Family Medicine | Admitting: Family Medicine

## 2023-04-29 ENCOUNTER — Other Ambulatory Visit: Payer: Self-pay

## 2023-04-29 DIAGNOSIS — Z87891 Personal history of nicotine dependence: Secondary | ICD-10-CM

## 2023-04-29 DIAGNOSIS — E0500 Thyrotoxicosis with diffuse goiter without thyrotoxic crisis or storm: Secondary | ICD-10-CM | POA: Diagnosis present

## 2023-04-29 DIAGNOSIS — E871 Hypo-osmolality and hyponatremia: Secondary | ICD-10-CM | POA: Diagnosis present

## 2023-04-29 DIAGNOSIS — L03116 Cellulitis of left lower limb: Principal | ICD-10-CM | POA: Diagnosis present

## 2023-04-29 DIAGNOSIS — L0390 Cellulitis, unspecified: Secondary | ICD-10-CM | POA: Diagnosis present

## 2023-04-29 DIAGNOSIS — M25562 Pain in left knee: Principal | ICD-10-CM

## 2023-04-29 DIAGNOSIS — Z833 Family history of diabetes mellitus: Secondary | ICD-10-CM

## 2023-04-29 DIAGNOSIS — Z79899 Other long term (current) drug therapy: Secondary | ICD-10-CM

## 2023-04-29 DIAGNOSIS — E0590 Thyrotoxicosis, unspecified without thyrotoxic crisis or storm: Secondary | ICD-10-CM | POA: Diagnosis present

## 2023-04-29 DIAGNOSIS — I10 Essential (primary) hypertension: Secondary | ICD-10-CM | POA: Diagnosis present

## 2023-04-29 DIAGNOSIS — Z87442 Personal history of urinary calculi: Secondary | ICD-10-CM | POA: Diagnosis present

## 2023-04-29 DIAGNOSIS — B029 Zoster without complications: Secondary | ICD-10-CM | POA: Diagnosis present

## 2023-04-29 HISTORY — DX: Essential (primary) hypertension: I10

## 2023-04-29 LAB — CBC WITH DIFFERENTIAL/PLATELET
Abs Immature Granulocytes: 0.03 10*3/uL (ref 0.00–0.07)
Basophils Absolute: 0 10*3/uL (ref 0.0–0.1)
Basophils Relative: 0 %
Eosinophils Absolute: 0.1 10*3/uL (ref 0.0–0.5)
Eosinophils Relative: 1 %
HCT: 46.3 % (ref 39.0–52.0)
Hemoglobin: 15.6 g/dL (ref 13.0–17.0)
Immature Granulocytes: 0 %
Lymphocytes Relative: 19 %
Lymphs Abs: 1.4 10*3/uL (ref 0.7–4.0)
MCH: 27.1 pg (ref 26.0–34.0)
MCHC: 33.7 g/dL (ref 30.0–36.0)
MCV: 80.4 fL (ref 80.0–100.0)
Monocytes Absolute: 0.5 10*3/uL (ref 0.1–1.0)
Monocytes Relative: 7 %
Neutro Abs: 5.3 10*3/uL (ref 1.7–7.7)
Neutrophils Relative %: 73 %
Platelets: 451 10*3/uL — ABNORMAL HIGH (ref 150–400)
RBC: 5.76 MIL/uL (ref 4.22–5.81)
RDW: 13.4 % (ref 11.5–15.5)
WBC: 7.3 10*3/uL (ref 4.0–10.5)
nRBC: 0 % (ref 0.0–0.2)

## 2023-04-29 LAB — COMPREHENSIVE METABOLIC PANEL
ALT: 15 U/L (ref 0–44)
AST: 13 U/L — ABNORMAL LOW (ref 15–41)
Albumin: 3.8 g/dL (ref 3.5–5.0)
Alkaline Phosphatase: 94 U/L (ref 38–126)
Anion gap: 9 (ref 5–15)
BUN: 12 mg/dL (ref 8–23)
CO2: 23 mmol/L (ref 22–32)
Calcium: 8.9 mg/dL (ref 8.9–10.3)
Chloride: 103 mmol/L (ref 98–111)
Creatinine, Ser: 0.73 mg/dL (ref 0.61–1.24)
GFR, Estimated: >60 mL/min (ref >60)
Glucose, Bld: 115 mg/dL — ABNORMAL HIGH (ref 70–99)
Potassium: 4 mmol/L (ref 3.5–5.1)
Sodium: 135 mmol/L (ref 135–145)
Total Bilirubin: 0.6 mg/dL (ref 0.0–1.2)
Total Protein: 8.4 g/dL — ABNORMAL HIGH (ref 6.5–8.1)

## 2023-04-29 MED ORDER — FENTANYL CITRATE PF 50 MCG/ML IJ SOSY
50.0000 ug | PREFILLED_SYRINGE | Freq: Once | INTRAMUSCULAR | Status: AC
  Administered 2023-04-30: 50 ug via INTRAVENOUS
  Filled 2023-04-29: qty 1

## 2023-04-29 MED ORDER — LIDOCAINE HCL (PF) 1 % IJ SOLN
5.0000 mL | Freq: Once | INTRAMUSCULAR | Status: AC
  Administered 2023-04-30: 5 mL
  Filled 2023-04-29: qty 30

## 2023-04-29 NOTE — ED Provider Notes (Incomplete)
 [place] EMERGENCY DEPARTMENT AT [facility] Provider Note   CSN: [id number] Arrival date & time: 04/29/23  1725     History {Add pertinent medical, surgical, social history, OB history to HPI:1} Chief Complaint  Patient presents with   Knee Pain    Vincent Gentry is a 63 y.o. male.  The history is provided by the patient and medical records.  Knee Pain  63 year old male presenting to the ED with left knee pain and redness.  Patient states he has had pain for a few days, yesterday noticed increased swelling, warmth to touch, and redness of the left knee.  He was seen at urgent care today and had an x-ray done with effusion present.  He was sent here for further evaluation.  He denies any injury, trauma, or fall.  He has not had any fever or chills.  No history of gout.  Has never had septic joint.  He was given Toradol at urgent care which seemed to help quite a bit with his pain.  Home Medications Prior to Admission medications   Medication Sig Start Date End Date Taking? Authorizing Provider  amLODipine (NORVASC) 10 MG tablet Take 1 tablet (10 mg total) by mouth daily. 04/22/19   Shade Flood, MD  benzonatate (TESSALON) 100 MG capsule Take 1 capsule (100 mg total) by mouth every 8 (eight) hours. Patient not taking: Reported on 11/30/2019 10/31/19   Melene Plan, DO  benzonatate (TESSALON) 100 MG capsule Take 1 capsule (100 mg total) by mouth 2 (two) times daily as needed for cough. Patient not taking: Reported on 11/30/2019 11/09/19   Ivonne Andrew, NP  metoprolol tartrate (LOPRESSOR) 25 MG tablet Take 0.5 tablets (12.5 mg total) by mouth 2 (two) times daily. 04/22/19   Shade Flood, MD  ondansetron (ZOFRAN ODT) 4 MG disintegrating tablet Take 1 tablet (4 mg total) by mouth every 8 (eight) hours as needed for nausea or vomiting. Patient not taking: Reported on 11/30/2019 10/31/19   Melene Plan, DO  ondansetron (ZOFRAN ODT) 4 MG disintegrating tablet Take 1 tablet  (4 mg total) by mouth every 8 (eight) hours as needed for nausea or vomiting. Patient not taking: Reported on 11/30/2019 11/09/19   Ivonne Andrew, NP      Allergies    Patient has no known allergies.    Review of Systems   Review of Systems  Musculoskeletal:  Positive for arthralgias and joint swelling.  All other systems reviewed and are negative.   Physical Exam Updated Vital Signs BP 130/80 (BP Location: Left Arm)   Pulse 94   Temp 98.5 F (36.9 C) (Oral)   Resp 18   Ht 6\' 1"  (1.854 m)   Wt 92.1 kg   SpO2 97%   BMI 26.78 kg/m  Physical Exam Vitals and nursing note reviewed.  Constitutional:      Appearance: He is well-developed.  HENT:     Head: Normocephalic and atraumatic.  Eyes:     Conjunctiva/sclera: Conjunctivae normal.     Pupils: Pupils are equal, round, and reactive to light.  Cardiovascular:     Rate and Rhythm: Normal rate and regular rhythm.     Heart sounds: Normal heart sounds.  Pulmonary:     Effort: Pulmonary effort is normal.     Breath sounds: Normal breath sounds.  Abdominal:     General: Bowel sounds are normal.     Palpations: Abdomen is soft.  Musculoskeletal:  General: Normal range of motion.     Cervical back: Normal range of motion.     Comments: Left knee is warm to touch, there is some overlying erythema without induration or streaking of the leg, able to flex and extend, somewhat limited due to swelling/effusion, leg remains neurovascularly intact  Skin:    General: Skin is warm and dry.  Neurological:     Mental Status: He is alert and oriented to person, place, and time.     ED Results / Procedures / Treatments   Labs (all labs ordered are listed, but only abnormal results are displayed) Labs Reviewed  CBC WITH DIFFERENTIAL/PLATELET - Abnormal; Notable for the following components:      Result Value   Platelets 451 (*)    All other components within normal limits  COMPREHENSIVE METABOLIC PANEL - Abnormal; Notable  for the following components:   Glucose, Bld 115 (*)    Total Protein 8.4 (*)    AST 13 (*)    All other components within normal limits  CULTURE, BLOOD (ROUTINE X 2)  CULTURE, BLOOD (ROUTINE X 2)  BODY FLUID CULTURE W GRAM STAIN  ANAEROBIC CULTURE W GRAM STAIN  LACTIC ACID, PLASMA  URIC ACID  SYNOVIAL CELL COUNT + DIFF, W/ CRYSTALS    EKG None  Radiology No results found.  Procedures Procedures  {Document cardiac monitor, telemetry assessment procedure when appropriate:1}  Medications Ordered in ED Medications  lidocaine (PF) (XYLOCAINE) 1 % injection 5 mL (has no administration in time range)  fentaNYL (SUBLIMAZE) injection 50 mcg (has no administration in time range)    ED Course/ Medical Decision Making/ A&P   {   Click here for ABCD2, HEART and other calculatorsREFRESH Note before signing :1}                              Medical Decision Making Amount and/or Complexity of Data Reviewed Labs: ordered.  Risk Prescription drug management.   ***  {Document critical care time when appropriate:1} {Document review of labs and clinical decision tools ie heart score, Chads2Vasc2 etc:1}  {Document your independent review of radiology images, and any outside records:1} {Document your discussion with family members, caretakers, and with consultants:1} {Document social determinants of health affecting pt's care:1} {Document your decision making why or why not admission, treatments were needed:1} Final Clinical Impression(s) / ED Diagnoses Final diagnoses:  None    Rx / DC Orders ED Discharge Orders     None

## 2023-04-29 NOTE — ED Notes (Addendum)
 provider at bedside

## 2023-04-29 NOTE — ED Triage Notes (Signed)
 Pt c/o left knee pain onset of last night, went to urgent care today and they took Xrays and sent him here for further workup concerning fluid around knee, L knee swollen, red and warm to touch

## 2023-04-30 ENCOUNTER — Emergency Department (HOSPITAL_COMMUNITY): Payer: Self-pay

## 2023-04-30 ENCOUNTER — Encounter (HOSPITAL_COMMUNITY): Payer: Self-pay

## 2023-04-30 DIAGNOSIS — L03116 Cellulitis of left lower limb: Secondary | ICD-10-CM

## 2023-04-30 DIAGNOSIS — L0390 Cellulitis, unspecified: Secondary | ICD-10-CM | POA: Diagnosis present

## 2023-04-30 DIAGNOSIS — E871 Hypo-osmolality and hyponatremia: Secondary | ICD-10-CM | POA: Insufficient documentation

## 2023-04-30 DIAGNOSIS — I10 Essential (primary) hypertension: Secondary | ICD-10-CM | POA: Insufficient documentation

## 2023-04-30 LAB — LACTIC ACID, PLASMA: Lactic Acid, Venous: 1.2 mmol/L (ref 0.5–1.9)

## 2023-04-30 LAB — BASIC METABOLIC PANEL
Anion gap: 9 (ref 5–15)
BUN: 15 mg/dL (ref 8–23)
CO2: 24 mmol/L (ref 22–32)
Calcium: 8.2 mg/dL — ABNORMAL LOW (ref 8.9–10.3)
Chloride: 98 mmol/L (ref 98–111)
Creatinine, Ser: 0.83 mg/dL (ref 0.61–1.24)
GFR, Estimated: >60 mL/min (ref >60)
Glucose, Bld: 123 mg/dL — ABNORMAL HIGH (ref 70–99)
Potassium: 3.6 mmol/L (ref 3.5–5.1)
Sodium: 131 mmol/L — ABNORMAL LOW (ref 135–145)

## 2023-04-30 LAB — CBC
HCT: 41.3 % (ref 39.0–52.0)
Hemoglobin: 13.4 g/dL (ref 13.0–17.0)
MCH: 26.7 pg (ref 26.0–34.0)
MCHC: 32.4 g/dL (ref 30.0–36.0)
MCV: 82.3 fL (ref 80.0–100.0)
Platelets: 403 10*3/uL — ABNORMAL HIGH (ref 150–400)
RBC: 5.02 MIL/uL (ref 4.22–5.81)
RDW: 13.5 % (ref 11.5–15.5)
WBC: 6.7 10*3/uL (ref 4.0–10.5)
nRBC: 0 % (ref 0.0–0.2)

## 2023-04-30 LAB — HIV ANTIBODY (ROUTINE TESTING W REFLEX): HIV Screen 4th Generation wRfx: REACTIVE — AB

## 2023-04-30 LAB — CREATININE, SERUM
Creatinine, Ser: 0.7 mg/dL (ref 0.61–1.24)
GFR, Estimated: >60 mL/min (ref >60)

## 2023-04-30 LAB — HEPATITIS B SURFACE ANTIGEN: Hepatitis B Surface Ag: NONREACTIVE

## 2023-04-30 LAB — URIC ACID: Uric Acid, Serum: 7.4 mg/dL (ref 3.7–8.6)

## 2023-04-30 MED ORDER — NAPROXEN 250 MG PO TABS
500.0000 mg | ORAL_TABLET | Freq: Two times a day (BID) | ORAL | Status: DC | PRN
  Administered 2023-04-30: 500 mg via ORAL
  Filled 2023-04-30 (×2): qty 2

## 2023-04-30 MED ORDER — ACETAMINOPHEN 650 MG RE SUPP: 650.0000 mg | Freq: Four times a day (QID) | RECTAL | Status: DC | PRN

## 2023-04-30 MED ORDER — VANCOMYCIN HCL IN DEXTROSE 1-5 GM/200ML-% IV SOLN: 1000.0000 mg | Freq: Once | INTRAVENOUS | Status: DC

## 2023-04-30 MED ORDER — CEFTRIAXONE SODIUM 2 G IJ SOLR
2.0000 g | Freq: Once | INTRAMUSCULAR | Status: AC
  Administered 2023-04-30: 2 g via INTRAVENOUS
  Filled 2023-04-30: qty 20

## 2023-04-30 MED ORDER — VANCOMYCIN HCL 1500 MG/300ML IV SOLN
1500.0000 mg | Freq: Two times a day (BID) | INTRAVENOUS | Status: DC
  Administered 2023-04-30 – 2023-05-01 (×2): 1500 mg via INTRAVENOUS
  Filled 2023-04-30 (×4): qty 300

## 2023-04-30 MED ORDER — ENOXAPARIN SODIUM 40 MG/0.4ML IJ SOSY
40.0000 mg | PREFILLED_SYRINGE | INTRAMUSCULAR | Status: DC
  Administered 2023-04-30: 40 mg via SUBCUTANEOUS
  Filled 2023-04-30: qty 0.4

## 2023-04-30 MED ORDER — ACETAMINOPHEN 325 MG PO TABS: 650.0000 mg | ORAL_TABLET | Freq: Four times a day (QID) | ORAL | Status: DC | PRN

## 2023-04-30 MED ORDER — VANCOMYCIN HCL 1750 MG/350ML IV SOLN
1750.0000 mg | Freq: Once | INTRAVENOUS | Status: AC
  Administered 2023-04-30: 1750 mg via INTRAVENOUS
  Filled 2023-04-30: qty 350

## 2023-04-30 MED ORDER — SODIUM CHLORIDE 0.9 % IV SOLN
2.0000 g | INTRAVENOUS | Status: DC
  Administered 2023-04-30: 2 g via INTRAVENOUS
  Filled 2023-04-30: qty 20

## 2023-04-30 MED ORDER — IOHEXOL 300 MG/ML  SOLN
100.0000 mL | Freq: Once | INTRAMUSCULAR | Status: AC | PRN
  Administered 2023-04-30: 100 mL via INTRAVENOUS

## 2023-04-30 NOTE — H&P (Signed)
 History and Physical    Vincent Gentry VHQ:[id number] DOB: 1960/08/27 DOA: 04/29/2023  Patient coming from: Home.  Chief Complaint: Left knee pain.  HPI: Vincent Gentry is a 63 y.o. male with history of multinodular goiter with hyperthyroidism status post radioactive iodine therapy presents to the ER with complaints of left knee pain since yesterday morning.  Denies any trauma.  Pain was significant and increased on moving his left knee.  Denies fever chills.  Three weeks ago patient had herpes zoster was treated with valacyclovir for a week.  ED Course: In the ER patient was afebrile.  ER physician attempted to aspirate the left knee joint but only minimal fluid was available.  Was sent for labs.  CT left knee with contrast shows no evidence of septic arthritis or effusion.  Does show some anterior subcutaneous swelling concerning for cellulitis.  Cultures were sent and started on empiric antibiotics admitted for observation.  Review of Systems: As per HPI, rest all negative.   Past Medical History:  Diagnosis Date   Kidney stone    Thyroid disease     Past Surgical History:  Procedure Laterality Date   kidney stone removal       reports that he has quit smoking. He has never used smokeless tobacco. He reports that he does not drink alcohol and does not use drugs.  No Known Allergies  Family History  Problem Relation Age of Onset   Diabetes Father    Thyroid disease Paternal Uncle     Prior to Admission medications   Medication Sig Start Date End Date Taking? Authorizing Provider  amLODipine (NORVASC) 10 MG tablet Take 1 tablet (10 mg total) by mouth daily. Patient not taking: Reported on 04/30/2023 04/22/19   Shade Flood, MD  atenolol (TENORMIN) 25 MG tablet Take 25 mg by mouth daily. Patient not taking: Reported on 04/30/2023 12/12/19   [provider]  benzonatate (TESSALON) 100 MG capsule Take 1 capsule (100 mg total) by mouth every 8 (eight) hours. Patient  not taking: Reported on 11/30/2019 10/31/19   Melene Plan, DO  benzonatate (TESSALON) 100 MG capsule Take 1 capsule (100 mg total) by mouth 2 (two) times daily as needed for cough. Patient not taking: Reported on 11/30/2019 11/09/19   Ivonne Andrew, NP  metoprolol tartrate (LOPRESSOR) 25 MG tablet Take 0.5 tablets (12.5 mg total) by mouth 2 (two) times daily. Patient not taking: Reported on 04/30/2023 04/22/19   Shade Flood, MD  ondansetron (ZOFRAN ODT) 4 MG disintegrating tablet Take 1 tablet (4 mg total) by mouth every 8 (eight) hours as needed for nausea or vomiting. Patient not taking: Reported on 11/30/2019 10/31/19   Melene Plan, DO  ondansetron (ZOFRAN ODT) 4 MG disintegrating tablet Take 1 tablet (4 mg total) by mouth every 8 (eight) hours as needed for nausea or vomiting. Patient not taking: Reported on 11/30/2019 11/09/19   Ivonne Andrew, NP  valACYclovir (VALTREX) 1000 MG tablet Take 1,000 mg by mouth 2 (two) times daily. Patient not taking: Reported on 04/30/2023 04/19/23   [provider]    Physical Exam: Constitutional: Moderately built and nourished. Vitals:   04/29/23 1740 04/29/23 2123 04/29/23 2353 04/30/23 0300  BP:  130/80 (!) 142/84 (!) 144/82  Pulse:  94 70 77  Resp:  18 18 18   Temp:  98.5 F (36.9 C) 98.7 F (37.1 C) 98.1 F (36.7 C)  TempSrc:  Oral    SpO2:  97% 98% 99%  Weight: 92.1 kg     Height: 6\' 1"  (1.854 m)      Eyes: Anicteric no pallor. ENMT: No discharge from the ears eyes nose or mouth. Neck: No mass felt.  No neck rigidity. Respiratory: No rhonchi or crepitations. Cardiovascular: S1-S2 heard. Abdomen: Soft nontender bowel sound present. Musculoskeletal: Left knee appears mildly swollen plantar aspect warm to touch.  Restricted mobility. Skin: Erythema and warmth on the anterior part of the left knee. Neurologic: Alert awake oriented time place and person.  Moves all extremities. Psychiatric: Appears normal.  Normal affect.   Labs  on Admission: I have personally reviewed following labs and imaging studies  CBC: Recent Labs  Lab 04/29/23 1748  WBC 7.3  NEUTROABS 5.3  HGB 15.6  HCT 46.3  MCV 80.4  PLT 451*   Basic Metabolic Panel: Recent Labs  Lab 04/29/23 1748  NA 135  K 4.0  CL 103  CO2 23  GLUCOSE 115*  BUN 12  CREATININE 0.73  CALCIUM 8.9   GFR: Estimated Creatinine Clearance: 108.2 mL/min (by C-G formula based on SCr of 0.73 mg/dL). Liver Function Tests: Recent Labs  Lab 04/29/23 1748  AST 13*  ALT 15  ALKPHOS 94  BILITOT 0.6  PROT 8.4*  ALBUMIN 3.8   No results for input(s): "LIPASE", "AMYLASE" in the last 168 hours. No results for input(s): "AMMONIA" in the last 168 hours. Coagulation Profile: No results for input(s): "INR", "PROTIME" in the last 168 hours. Cardiac Enzymes: No results for input(s): "CKTOTAL", "CKMB", "CKMBINDEX", "TROPONINI" in the last 168 hours. BNP (last 3 results) No results for input(s): "PROBNP" in the last 8760 hours. HbA1C: No results for input(s): "HGBA1C" in the last 72 hours. CBG: No results for input(s): "GLUCAP" in the last 168 hours. Lipid Profile: No results for input(s): "CHOL", "HDL", "LDLCALC", "TRIG", "CHOLHDL", "LDLDIRECT" in the last 72 hours. Thyroid Function Tests: No results for input(s): "TSH", "T4TOTAL", "FREET4", "T3FREE", "THYROIDAB" in the last 72 hours. Anemia Panel: No results for input(s): "VITAMINB12", "FOLATE", "FERRITIN", "TIBC", "IRON", "RETICCTPCT" in the last 72 hours. Urine analysis:    Component Value Date/Time   BILIRUBINUR neg 07/01/2013 1101   PROTEINUR neg 07/01/2013 1101   UROBILINOGEN 0.2 07/01/2013 1101   NITRITE neg 07/01/2013 1101   LEUKOCYTESUR Negative 07/01/2013 1101   Sepsis Labs: @LABRCNTIP (procalcitonin:4,lacticidven:4) ) Recent Results (from the past 240 hours)  Body fluid culture w Gram Stain     Status: None (Preliminary result)   Collection Time: 04/30/23  1:00 AM   Specimen: KNEE; Synovial  Fluid  Result Value Ref Range Status   Specimen Description KNEE  Final   Special Requests NONE  Final   Gram Stain   Final    FEW WBC SEEN NO ORGANISMS SEEN Performed at [facility], [address], [place], [place] [address]    Culture PENDING  Incomplete   Report Status PENDING  Incomplete     Radiological Exams on Admission: CT KNEE LEFT W CONTRAST Result Date: 04/30/2023 CLINICAL DATA:  Septic arthritis suspected, knee x-ray done. Sent from urgent care for possible septic joint. Left knee pain and swelling and redness and warmth. EXAM: CT OF THE LEFT KNEE WITH CONTRAST TECHNIQUE: Multidetector CT imaging was performed following the standard protocol during bolus administration of intravenous contrast. RADIATION DOSE REDUCTION: This exam was performed according to the departmental dose-optimization program which includes automated exposure control, adjustment of the mA and/or kV according to patient size and/or use of iterative reconstruction technique. CONTRAST:  OMNIPAQUE IOHEXOL 300 MG/ML  SOLN COMPARISON:  None Available. FINDINGS: Bones/Joint/Cartilage No acute fracture or dislocation. No knee joint effusion. The articular surface of the femur, tibia, and patella are intact. Ligaments Suboptimally assessed by CT. Muscles and Tendons No acute abnormality. No evidence of deep tissue infection. Soft tissues Subcutaneous soft tissue edema anterior to the patella. No abscess or soft tissue gas. IMPRESSION: 1. Subcutaneous soft tissue edema anterior to the patella is nonspecific but can be seen with cellulitis. No abscess or soft tissue gas. 2. No knee joint effusion or evidence of septic arthritis. Electronically Signed   By: Minerva Fester M.D.   On: 04/30/2023 03:28     Assessment/Plan Principal Problem:   Cellulitis Active Problems:   History of kidney stones   Hyperthyroidism    Cellulitis of the left lower extremity -   CT scan of the left knee does not  show any evidence of septic arthritis or effusion.  Does show some subcutaneous soft tissue edema concerning for cellulitis.  Left knee joint aspiration was attempted by the ER physician and only very minimal fluid was available.  Labs are pending.  On empiric antibiotics.  Follow cultures. Prior history of hyperthyroidism status post radioactive iodine therapy need to follow-up with primary care. Recent treatment for herpes zoster 3 weeks ago.  Since patient has cellulitis will need close monitoring further management and more than 2 midnight stay.   DVT prophylaxis: Lovenox. Code Status: Full code. Family Communication: Discussed with patient. Disposition Plan: Medical floor. Consults called: None. Admission status: Observation.

## 2023-04-30 NOTE — Progress Notes (Signed)
 Pharmacy Antibiotic Note  Vincent Gentry is a 63 y.o. male admitted on 04/29/2023 with left knee pain and redness. .  Pharmacy has been consulted for vancomycin dosing for septic knee.  Plan: Vancomycin 1750mg  IV x 1 then 1500mg  IV q12h (AUC 480.2, Scr 0.8) Follow renal function, cultures and clinical course  Height: 6\' 1"  (185.4 cm) Weight: 92.1 kg (203 lb) IBW/kg (Calculated) : 79.9  Temp (24hrs), Avg:98.5 F (36.9 C), Min:98.1 F (36.7 C), Max:98.7 F (37.1 C)  Recent Labs  Lab 04/29/23 1748 04/30/23 0007  WBC 7.3  --   CREATININE 0.73  --   LATICACIDVEN  --  1.2    Estimated Creatinine Clearance: 108.2 mL/min (by C-G formula based on SCr of 0.73 mg/dL).    No Known Allergies  Antimicrobials this admission: 3/20 vanc >> 3/20 CTX x1   Dose adjustments this admission:   Microbiology results: 3/20 BCx:  3/20 knee fluid:  Thank you for allowing pharmacy to be a part of this patient's care.  Arley Phenix RPh 04/30/2023, 3:59 AM

## 2023-04-30 NOTE — Plan of Care (Signed)

## 2023-04-30 NOTE — Progress Notes (Signed)
    Patient: Vincent Gentry BJY:[id number] DOB: 1960/09/07      Brief hospital course: 63 y.o. M with HTN, hyperthyroidism p/w LLE swelling and pain.  CT L knee unremarkable in the ER, admitted for IV antibiotics.    This is a no charge note, for further details, please see the H&P by my partner, Dr. Toniann Fail from earlier today.   Principal Problem:   Left lower extremity cellulitis Active Problems:   History of kidney stones   Hyperthyroidism   Hyponatremia   Essential hypertension    Cultures of blood and synovial fluid NGTD  Continue IV antibiotics.  If culture data normal tomorrow at 48 hours, will switch to orals and dc home.     Physical Exam: BP (!) 139/97   Pulse 75   Temp 98.2 F (36.8 C) (Oral)   Resp 18   Ht 6\' 1"  (1.854 m)   Wt 92.1 kg   SpO2 95%   BMI 26.78 kg/m   Patient seen and examined.     Family Communication: Daughter by phone        Author: Alberteen Sam, MD 04/30/2023 3:21 PM

## 2023-04-30 NOTE — Hospital Course (Addendum)
 63 y.o. M with HTN, hyperthyroidism p/w LLE swelling and pain.  CT L knee unremarkable in the ER, admitted for IV antibiotics.

## 2023-05-01 ENCOUNTER — Other Ambulatory Visit (HOSPITAL_COMMUNITY): Payer: Self-pay

## 2023-05-01 LAB — HCV AB W REFLEX TO QUANT PCR: HCV Ab: NONREACTIVE

## 2023-05-01 LAB — HEPATITIS B SURFACE ANTIBODY, QUANTITATIVE: Hep B S AB Quant (Post): <3.5 m[IU]/mL — ABNORMAL LOW

## 2023-05-01 LAB — HIV-1 RNA QUANT-NO REFLEX-BLD
HIV 1 RNA Quant: <20 {copies}/mL
LOG10 HIV-1 RNA: UNDETERMINED {Log_copies}/mL

## 2023-05-01 LAB — HCV INTERPRETATION

## 2023-05-01 MED ORDER — NAPROXEN SODIUM 220 MG PO TABS: 440.0000 mg | ORAL_TABLET | Freq: Two times a day (BID) | ORAL | Status: AC | PRN

## 2023-05-01 MED ORDER — CEFADROXIL 500 MG PO CAPS
500.0000 mg | ORAL_CAPSULE | Freq: Two times a day (BID) | ORAL | 0 refills | Status: AC
  Filled 2023-05-01: qty 12, 6d supply, fill #0

## 2023-05-01 NOTE — Progress Notes (Signed)
 Mobility Specialist - Progress Note   05/01/23 0909  Mobility  Activity Ambulated with assistance in hallway  Level of Assistance Modified independent, requires aide device or extra time  Assistive Device Other (Comment) (IV Pole)  Distance Ambulated (ft) 500 ft  Activity Response Tolerated well  Mobility Referral Yes  Mobility visit 1 Mobility  Mobility Specialist Start Time (ACUTE ONLY) 0859  Mobility Specialist Stop Time (ACUTE ONLY) 0908  Mobility Specialist Time Calculation (min) (ACUTE ONLY) 9 min   Pt received in bed and agreeable to mobility. No complaints during session. Pt to bed after session with all needs met.    [facility]

## 2023-05-01 NOTE — TOC Initial Note (Signed)
 Transition of Care [facility]) - Initial/Assessment Note    Patient Details  Name: Vincent Gentry MRN: [id number] Date of Birth: Dec 24, 1960  [facility]) CM/SW Contact:    Adrian Prows, RN Phone Number: 05/01/2023, 11:49 AM  Clinical Narrative:                 TOC for d/c planning; no PCP/insurance listed; spoke w/ pt in room; pt says he lives at home w/ his dtr Vincent Gentry (dtr) [phone]; he plans to return at d/c; family will provide transportation; he denies SDOH risks; pt confirmed that he does not have PCP or insurance; pt says he does not have DME, HH services, or home oxygen; he agreed to receive resources for Social Service, and [employer]; he will make his own appt at agency of choice; resources placed in d/c instructions; pt also given copies or resources; no TOC needs.  Expected Discharge Plan: Home/Self Care Barriers to Discharge: No Barriers Identified   Patient Goals and CMS Choice Patient states their goals for this hospitalization and ongoing recovery are:: home          Expected Discharge Plan and Services   Discharge Planning Services: CM Consult Post Acute Care Choice: NA Living arrangements for the past 2 months: Single Family Home Expected Discharge Date: 05/01/23               DME Arranged: N/A DME Agency: NA       HH Arranged: NA HH Agency: NA        Prior Living Arrangements/Services Living arrangements for the past 2 months: Single Family Home Lives with:: Adult Children Patient language and need for interpreter reviewed:: Yes Do you feel safe going back to the place where you live?: Yes      Need for Family Participation in Patient Care: Yes (Comment) Care giver support system in place?: Yes (comment) Current home services:  (n/a) Criminal Activity/Legal Involvement Pertinent to Current Situation/Hospitalization: No - Comment as needed  Activities of Daily Living      Permission  Sought/Granted Permission sought to share information with : Case Manager Permission granted to share information with : Yes, Verbal Permission Granted  Share Information with NAME: Case Manger     Permission granted to share info w Relationship: Jagar Lua (dtr) [phone]     Emotional Assessment Appearance:: Appears stated age Attitude/Demeanor/Rapport: Gracious Affect (typically observed): Accepting Orientation: : Oriented to Self, Oriented to Place, Oriented to  Time, Oriented to Situation Alcohol / Substance Use: Not Applicable Psych Involvement: No (comment)  Admission diagnosis:  Cellulitis [L03.90] Acute pain of left knee [M25.562] Patient Active Problem List   Diagnosis Date Noted   Left lower extremity cellulitis 04/30/2023   Hyponatremia 04/30/2023   Essential hypertension 04/30/2023   History of COVID-19 11/09/2019   Cough 11/09/2019   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 [place] [address] Phone: [phone] Fax: [phone]  [facility] [address] [place] [address] Phone: [phone] Fax: [phone]     Social Drivers of Health (SDOH) Social History: SDOH Screenings   Food Insecurity: No Food Insecurity (05/01/2023)  Housing: Low Risk  (05/01/2023)  Transportation Needs: No Transportation Needs (05/01/2023)  Utilities: Not At Risk (05/01/2023)  Depression (PHQ2-9): Low  Risk  (04/22/2019)  Financial Resource Strain: Not on File (04/29/2022)   Received from [facility]  Physical Activity: Not on File (04/29/2022)   Received from [facility]  Social Connections: Not on File (10/26/2022)   Received from [facility]  Stress: Not on File (04/29/2022)   Received from [facility]  Tobacco Use: Medium Risk (04/30/2023)   SDOH  Interventions: Food Insecurity Interventions: Intervention Not Indicated, Inpatient TOC Housing Interventions: Intervention Not Indicated, Inpatient TOC Transportation Interventions: Intervention Not Indicated, Inpatient TOC Utilities Interventions: Intervention Not Indicated, Inpatient TOC   Readmission Risk Interventions     No data to display

## 2023-05-01 NOTE — Discharge Instructions (Signed)
 [facility]

## 2023-05-01 NOTE — Progress Notes (Signed)
 AVS reviewed w/ patient who verbalized an understanding. No other questions at this time. Pt dressed for d/c to home- PIV removed as noted. TOC med delivered in a secure bag to pt in room by this RN

## 2023-05-01 NOTE — Discharge Summary (Signed)
 Physician Discharge Summary   Patient: Vincent Gentry MRN: [id number] DOB: 1960-06-03  Admit date:     04/29/2023  Discharge date: 05/01/23  Discharge Physician: Alberteen Sam   PCP: Pcp, No     Recommendations at discharge:  Follow up with [facility] for positive HIV screen Dr. Maryfrances Bunnell to follow up pending HIV RNA Needs PCP follow up of: BP management TSH     Discharge Diagnoses: Principal Problem:   Left lower extremity cellulitis Active Problems:   Hyperthyroidism   Hyponatremia   Essential hypertension   Positive HIV screen     Hospital Course: 63 y.o. M with HTN, hyperthyroidism p/w LLE swelling and pain.  CT L knee unremarkable in the ER, admitted for IV antibiotics.   Cellulitis Left knee red and swollen.  CT imaging here showed no effusion.  Joint aspirated in the ER and sent for culture.  No growth at time of discharge, and patient ambulating well, low clinical suspicion for infected joint or bacteremia.  Return precautions given.  Discharged on cefadroxil.    Hyperthyroidism History of multinodular goiter s/p RAI.  Last saw Endo in 2021 due to recurrence of suppressed TSH, had mild symptoms at that time, started on atenolol, lost to follow up.  Here he is asymptomatic.  TSH not rechecked.  Recommend PCP follow up, repeat TSH and re-referral to Endo if abnormal.   Abnormal HIV screen No extramarital partners, no IVDU, no incarceration.  Possible false positive.  Case discussed with ID Dr. Daiva Eves.  RNA quant ordered for confirmation.  Dr. Daiva Eves and I will follow up with this.  He was given contact information for [facility] if needed.  Hypertension BP 140s systolic here.  No longer taking antihypertensives.  Recommended to reestablish with PCP         The [facility] Controlled Substances Registry was reviewed for this patient prior to discharge.    Procedures performed: Knee aspiration   Disposition: Home Diet recommendation:  Regular  diet  DISCHARGE MEDICATION: Allergies as of 05/01/2023   No Known Allergies      Medication List     TAKE these medications    cefadroxil 500 MG capsule Commonly known as: DURICEF Take 1 capsule (500 mg total) by mouth 2 (two) times daily for 6 days.   naproxen sodium 220 MG tablet Commonly known as: ALEVE Take 2 tablets (440 mg total) by mouth 2 (two) times daily as needed.        Follow-up Information     Daiva Eves, Lisette Grinder, MD. Schedule an appointment as soon as possible for a visit in 2 week(s).   Specialty: Infectious Diseases Contact information: [address] [place] [address] [phone]                 Discharge Instructions     Discharge instructions   Complete by: As directed    **IMPORTANT DISCHARGE INSTRUCTIONS**   From Dr. Maryfrances Bunnell: You were admitted for knee pain, swelling and redness  We believe this was from infection.  You had a CT scan that showed no concerning findings. You had the knee joint aspirated, and the fluid so far is not growing bacteria. You had blood cultures drawn which also are normal so far   Take the antibiotic cefadroxil 500 mg twice daily for 6 more days  For pain, take naproxen/Aleve 440 mg (two tabs) up to twice daily for 1 week Do not take Aleve longer than 1 week  Some degree of redness or swelling may linger for a few weeks, but if you have fever, chills, or severe worsening pain or swelling, return to the ER  You should establish with a new primary care (mostly for follow up of your blood pressure)  For your abnormal blood test, go see the Infectious Disease clinic.  (See below in the To Do section, Dr. Daiva Eves) Call that office and let them know, you were just in the hospital, your doctor here spoke with Dr. Daiva Eves and he recommended you follow up in 2-3 weeks   Increase activity slowly   Complete by: As directed        Discharge Exam: Filed Weights   04/29/23 1740  Weight: 92.1  kg   BP 138/80 (BP Location: Right Arm)   Pulse 75   Temp 98.3 F (36.8 C) (Oral)   Resp 18   Ht 6\' 1"  (1.854 m)   Wt 92.1 kg   SpO2 100%   BMI 26.78 kg/m   General: Pt is alert, awake, not in acute distress Cardiovascular: RRR, nl S1-S2, no murmurs appreciated.   No LE edema.   Respiratory: Normal respiratory rate and rhythm.  CTAB without rales or wheezes. Abdominal: Abdomen soft and non-tender.  No distension or HSM.   MSK: The left knee appears benign, redness resolved, mild effusion Neuro/Psych: Strength symmetric in upper and lower extremities.  Judgment and insight appear normal.   Condition at discharge: good  The results of significant diagnostics from this hospitalization (including imaging, microbiology, ancillary and laboratory) are listed below for reference.   Imaging Studies: CT KNEE LEFT W CONTRAST Result Date: 04/30/2023 CLINICAL DATA:  Septic arthritis suspected, knee x-ray done. Sent from urgent care for possible septic joint. Left knee pain and swelling and redness and warmth. EXAM: CT OF THE LEFT KNEE WITH CONTRAST TECHNIQUE: Multidetector CT imaging was performed following the standard protocol during bolus administration of intravenous contrast. RADIATION DOSE REDUCTION: This exam was performed according to the departmental dose-optimization program which includes automated exposure control, adjustment of the mA and/or kV according to patient size and/or use of iterative reconstruction technique. CONTRAST:  OMNIPAQUE IOHEXOL 300 MG/ML  SOLN COMPARISON:  None Available. FINDINGS: Bones/Joint/Cartilage No acute fracture or dislocation. No knee joint effusion. The articular surface of the femur, tibia, and patella are intact. Ligaments Suboptimally assessed by CT. Muscles and Tendons No acute abnormality. No evidence of deep tissue infection. Soft tissues Subcutaneous soft tissue edema anterior to the patella. No abscess or soft tissue gas. IMPRESSION: 1.  Subcutaneous soft tissue edema anterior to the patella is nonspecific but can be seen with cellulitis. No abscess or soft tissue gas. 2. No knee joint effusion or evidence of septic arthritis. Electronically Signed   By: Minerva Fester M.D.   On: 04/30/2023 03:28    Microbiology: Results for orders placed or performed during the hospital encounter of 04/29/23  Blood culture (routine x 2)     Status: None (Preliminary result)   Collection Time: 04/30/23 12:07 AM   Specimen: Left Antecubital; Blood  Result Value Ref Range Status   Specimen Description   Final    LEFT ANTECUBITAL Performed at [facility], [address] [place], [place] [address]    Special Requests   Final    BOTTLES DRAWN AEROBIC AND ANAEROBIC Blood Culture results may not be optimal due to an inadequate volume of blood received in culture bottles Performed at [facility],  [address] [place], [place] [address]    Culture   Final    NO GROWTH 1 DAY Performed at [facility], [address], [place], [place] [address]    Report Status PENDING  Incomplete  Blood culture (routine x 2)     Status: None (Preliminary result)   Collection Time: 04/30/23 12:19 AM   Specimen: BLOOD LEFT HAND  Result Value Ref Range Status   Specimen Description   Final    BLOOD LEFT HAND Performed at [facility], [address] [place], [place] [address]    Special Requests   Final    BOTTLES DRAWN AEROBIC ONLY Blood Culture results may not be optimal due to an inadequate volume of blood received in culture bottles Performed at [facility], [address], [place], [place] [address]    Culture   Final    NO GROWTH 1 DAY Performed at [facility], [address] [place], [place] [address]    Report Status PENDING  Incomplete  Body fluid culture w Gram Stain     Status: None (Preliminary result)   Collection Time: 04/30/23  1:00 AM    Specimen: KNEE; Synovial Fluid  Result Value Ref Range Status   Specimen Description   Final    KNEE Performed at [facility], [address], [place], [place] [address]    Special Requests   Final    NONE Performed at [facility], [address] [place], [place] [address]    Gram Stain   Final    FEW WBC SEEN NO ORGANISMS SEEN Performed at [facility], [address], [place], [place] [address]    Culture   Final    NO GROWTH 1 DAY Performed at [facility], [address], [place], [place] [address]    Report Status PENDING  Incomplete    Labs: CBC: Recent Labs  Lab 04/29/23 1748 04/30/23 0550  WBC 7.3 6.7  NEUTROABS 5.3  --   HGB 15.6 13.4  HCT 46.3 41.3  MCV 80.4 82.3  PLT 451* 403*   Basic Metabolic Panel: Recent Labs  Lab 04/29/23 1748 04/30/23 0550  NA 135 131*  K 4.0 3.6  CL 103 98  CO2 23 24  GLUCOSE 115* 123*  BUN 12 15  CREATININE 0.73 0.70  0.83  CALCIUM 8.9 8.2*   Liver Function Tests: Recent Labs  Lab 04/29/23 1748  AST 13*  ALT 15  ALKPHOS 94  BILITOT 0.6  PROT 8.4*  ALBUMIN 3.8   CBG: No results for input(s): "GLUCAP" in the last 168 hours.  Discharge time spent: approximately 45 minutes spent on discharge counseling, evaluation of patient on day of discharge, and coordination of discharge planning with nursing, social work, pharmacy and case management  Signed: Alberteen Sam, MD [facility] 05/01/2023

## 2023-05-02 LAB — HIV-1/HIV-2 QUALITATIVE RNA
Final Interpretation: NEGATIVE
HIV-1 RNA, Qualitative: NONREACTIVE
HIV-2 RNA, Qualitative: NONREACTIVE

## 2023-05-02 LAB — HIV-1/2 AB - DIFFERENTIATION
HIV 1 Ab: NONREACTIVE
HIV 2 Ab: NONREACTIVE
Note: NEGATIVE

## 2023-05-03 ENCOUNTER — Telehealth: Payer: Self-pay | Admitting: Family Medicine

## 2023-05-03 LAB — BODY FLUID CULTURE W GRAM STAIN: Culture: NO GROWTH

## 2023-05-03 NOTE — Telephone Encounter (Signed)
 Spoke with patient.  Discussed repeat HIV testing all normal.  He is aware.  No signs of HIV, no further testing or follow up needed.    His knee is doing well also, the synovial fluid culture had no growth.

## 2023-05-05 LAB — CULTURE, BLOOD (ROUTINE X 2)
Culture: NO GROWTH
Culture: NO GROWTH

## 2023-05-13 ENCOUNTER — Inpatient Hospital Stay: Payer: Self-pay | Admitting: Internal Medicine
